# Patient Record
Sex: Male | Born: 1950 | Race: Black or African American | Hispanic: No
Health system: Southern US, Community
[De-identification: ages and names within clinical notes are randomized; demographics above are authoritative.]

---

## 2013-02-23 ENCOUNTER — Ambulatory Visit: Payer: Self-pay | Admitting: Radiation Oncology

## 2013-03-02 ENCOUNTER — Ambulatory Visit: Payer: Self-pay | Admitting: Oncology

## 2013-03-02 LAB — CBC CANCER CENTER
Basophil #: 0 x10 3/mm (ref 0.0–0.1)
Basophil %: 0.1 %
Eosinophil %: 0 %
HCT: 29.6 % — ABNORMAL LOW (ref 40.0–52.0)
Lymphocyte #: 0.6 x10 3/mm — ABNORMAL LOW (ref 1.0–3.6)
MCH: 22.1 pg — ABNORMAL LOW (ref 26.0–34.0)
Monocyte #: 0.6 x10 3/mm (ref 0.2–1.0)
Monocyte %: 4.9 %
Neutrophil %: 90 %
RBC: 4.05 10*6/uL — ABNORMAL LOW (ref 4.40–5.90)
WBC: 11.4 x10 3/mm — ABNORMAL HIGH (ref 3.8–10.6)

## 2013-03-02 LAB — COMPREHENSIVE METABOLIC PANEL
Albumin: 3.1 g/dL — ABNORMAL LOW (ref 3.4–5.0)
BUN: 24 mg/dL — ABNORMAL HIGH (ref 7–18)
Bilirubin,Total: 0.3 mg/dL (ref 0.2–1.0)
Calcium, Total: 8.6 mg/dL (ref 8.5–10.1)
Chloride: 104 mmol/L (ref 98–107)
Co2: 28 mmol/L (ref 21–32)
Creatinine: 0.96 mg/dL (ref 0.60–1.30)
EGFR (African American): >60
SGOT(AST): 12 U/L — ABNORMAL LOW (ref 15–37)
Sodium: 144 mmol/L (ref 136–145)
Total Protein: 7.5 g/dL (ref 6.4–8.2)

## 2013-03-03 LAB — CEA: CEA: 379.3 ng/mL — ABNORMAL HIGH (ref 0.0–4.7)

## 2013-03-13 ENCOUNTER — Ambulatory Visit: Payer: Self-pay | Admitting: Vascular Surgery

## 2013-03-14 ENCOUNTER — Ambulatory Visit: Payer: Self-pay | Admitting: Radiation Oncology

## 2013-03-14 ENCOUNTER — Ambulatory Visit: Payer: Self-pay | Admitting: Oncology

## 2013-03-16 LAB — PROTIME-INR: INR: 1.1

## 2013-03-21 LAB — CBC CANCER CENTER
Basophil #: 0.1 x10 3/mm (ref 0.0–0.1)
HCT: 31.6 % — ABNORMAL LOW (ref 40.0–52.0)
HGB: 9.6 g/dL — ABNORMAL LOW (ref 13.0–18.0)
Lymphocyte #: 1.3 x10 3/mm (ref 1.0–3.6)
Lymphocyte %: 10.6 %
MCHC: 30.3 g/dL — ABNORMAL LOW (ref 32.0–36.0)
Monocyte %: 12.7 %
Neutrophil #: 9.6 x10 3/mm — ABNORMAL HIGH (ref 1.4–6.5)
RBC: 4.19 10*6/uL — ABNORMAL LOW (ref 4.40–5.90)
RDW: 31.2 % — ABNORMAL HIGH (ref 11.5–14.5)
WBC: 12.7 x10 3/mm — ABNORMAL HIGH (ref 3.8–10.6)

## 2013-03-21 LAB — COMPREHENSIVE METABOLIC PANEL
Albumin: 2.9 g/dL — ABNORMAL LOW (ref 3.4–5.0)
Anion Gap: 11 (ref 7–16)
Calcium, Total: 8.2 mg/dL — ABNORMAL LOW (ref 8.5–10.1)
Co2: 30 mmol/L (ref 21–32)
Creatinine: 1.08 mg/dL (ref 0.60–1.30)
EGFR (African American): >60
EGFR (Non-African Amer.): >60
Glucose: 125 mg/dL — ABNORMAL HIGH (ref 65–99)
Osmolality: 288 (ref 275–301)
Potassium: 2.5 mmol/L — CL (ref 3.5–5.1)
SGPT (ALT): 22 U/L (ref 12–78)
Sodium: 144 mmol/L (ref 136–145)

## 2013-03-21 LAB — PROTIME-INR
INR: 1.6
Prothrombin Time: 19.3 secs — ABNORMAL HIGH (ref 11.5–14.7)

## 2013-03-28 LAB — CBC CANCER CENTER
Basophil #: 0 x10 3/mm (ref 0.0–0.1)
Eosinophil #: 0.1 x10 3/mm (ref 0.0–0.7)
Eosinophil %: 1.2 %
HCT: 30.1 % — ABNORMAL LOW (ref 40.0–52.0)
HGB: 9.2 g/dL — ABNORMAL LOW (ref 13.0–18.0)
Lymphocyte #: 1.4 x10 3/mm (ref 1.0–3.6)
Lymphocyte %: 15 %
Lymphocytes: 15 %
MCHC: 30.6 g/dL — ABNORMAL LOW (ref 32.0–36.0)
Monocyte %: 8.1 %
Monocytes: 5 %
Neutrophil #: 7 x10 3/mm — ABNORMAL HIGH (ref 1.4–6.5)
RBC: 3.97 10*6/uL — ABNORMAL LOW (ref 4.40–5.90)
WBC: 9.4 x10 3/mm (ref 3.8–10.6)

## 2013-03-28 LAB — COMPREHENSIVE METABOLIC PANEL
Albumin: 2.7 g/dL — ABNORMAL LOW (ref 3.4–5.0)
Alkaline Phosphatase: 80 U/L (ref 50–136)
Anion Gap: 6 — ABNORMAL LOW (ref 7–16)
Calcium, Total: 8.3 mg/dL — ABNORMAL LOW (ref 8.5–10.1)
Co2: 31 mmol/L (ref 21–32)
Creatinine: 1.08 mg/dL (ref 0.60–1.30)
EGFR (African American): >60
EGFR (Non-African Amer.): >60
Glucose: 137 mg/dL — ABNORMAL HIGH (ref 65–99)
Osmolality: 286 (ref 275–301)
SGPT (ALT): 26 U/L (ref 12–78)
Sodium: 143 mmol/L (ref 136–145)
Total Protein: 6.8 g/dL (ref 6.4–8.2)

## 2013-03-28 LAB — PROTIME-INR: Prothrombin Time: 26.7 secs — ABNORMAL HIGH (ref 11.5–14.7)

## 2013-04-03 LAB — CBC CANCER CENTER
Basophil %: 0.7 %
Eosinophil #: 0.1 x10 3/mm (ref 0.0–0.7)
Eosinophil %: 1 %
HCT: 31.2 % — ABNORMAL LOW (ref 40.0–52.0)
HGB: 9.7 g/dL — ABNORMAL LOW (ref 13.0–18.0)
Lymphocyte %: 14.4 %
MCH: 23.2 pg — ABNORMAL LOW (ref 26.0–34.0)
MCHC: 30.9 g/dL — ABNORMAL LOW (ref 32.0–36.0)
MCV: 75 fL — ABNORMAL LOW (ref 80–100)
Monocyte %: 5.5 %
Neutrophil #: 8.6 x10 3/mm — ABNORMAL HIGH (ref 1.4–6.5)
Platelet: 276 x10 3/mm (ref 150–440)
RBC: 4.16 10*6/uL — ABNORMAL LOW (ref 4.40–5.90)
WBC: 11 x10 3/mm — ABNORMAL HIGH (ref 3.8–10.6)

## 2013-04-03 LAB — COMPREHENSIVE METABOLIC PANEL
Alkaline Phosphatase: 87 U/L (ref 50–136)
Anion Gap: 9 (ref 7–16)
BUN: 13 mg/dL (ref 7–18)
Bilirubin,Total: 0.3 mg/dL (ref 0.2–1.0)
Calcium, Total: 8.5 mg/dL (ref 8.5–10.1)
Co2: 30 mmol/L (ref 21–32)
Creatinine: 1.06 mg/dL (ref 0.60–1.30)
EGFR (African American): >60
Osmolality: 287 (ref 275–301)
SGOT(AST): 23 U/L (ref 15–37)
Sodium: 144 mmol/L (ref 136–145)

## 2013-04-03 LAB — PROTIME-INR: Prothrombin Time: 32.3 secs — ABNORMAL HIGH (ref 11.5–14.7)

## 2013-04-03 LAB — IRON AND TIBC
Iron Saturation: 15 %
Iron: 52 ug/dL — ABNORMAL LOW (ref 65–175)

## 2013-04-10 LAB — CBC CANCER CENTER
Basophil #: 0 "x10 3/mm "
Basophil %: 0.3 %
Eosinophil #: 0.2 "x10 3/mm "
Eosinophil %: 3.8 %
HCT: 32.7 % — ABNORMAL LOW
HGB: 10.4 g/dL — ABNORMAL LOW
Lymphocyte %: 22.9 %
Lymphs Abs: 1.1 "x10 3/mm "
MCH: 24.1 pg — ABNORMAL LOW
MCHC: 31.7 g/dL — ABNORMAL LOW
MCV: 76 fL — ABNORMAL LOW
Monocyte #: 0.7 "x10 3/mm "
Monocyte %: 13.4 %
Neutrophil #: 3 "x10 3/mm "
Neutrophil %: 59.6 %
Platelet: 261 "x10 3/mm "
RBC: 4.29 "x10 6/mm " — ABNORMAL LOW
RDW: 28.6 % — ABNORMAL HIGH
WBC: 5 "x10 3/mm "

## 2013-04-10 LAB — COMPREHENSIVE METABOLIC PANEL WITH GFR
Albumin: 2.7 g/dL — ABNORMAL LOW
Alkaline Phosphatase: 76 U/L
Anion Gap: 9
BUN: 16 mg/dL
Bilirubin,Total: 0.4 mg/dL
Calcium, Total: 8 mg/dL — ABNORMAL LOW
Chloride: 106 mmol/L
Co2: 28 mmol/L
Creatinine: 1.02 mg/dL
EGFR (African American): >60
EGFR (Non-African Amer.): >60
Glucose: 102 mg/dL — ABNORMAL HIGH
Osmolality: 286
Potassium: 3.3 mmol/L — ABNORMAL LOW
SGOT(AST): 17 U/L
SGPT (ALT): 22 U/L
Sodium: 143 mmol/L
Total Protein: 6.2 g/dL — ABNORMAL LOW

## 2013-04-13 ENCOUNTER — Ambulatory Visit: Payer: Self-pay | Admitting: Radiation Oncology

## 2013-04-13 ENCOUNTER — Ambulatory Visit: Payer: Self-pay | Admitting: Oncology

## 2013-04-17 LAB — COMPREHENSIVE METABOLIC PANEL
Albumin: 2.7 g/dL — ABNORMAL LOW (ref 3.4–5.0)
Alkaline Phosphatase: 75 U/L (ref 50–136)
Anion Gap: 11 (ref 7–16)
BUN: 11 mg/dL (ref 7–18)
Bilirubin,Total: 0.4 mg/dL (ref 0.2–1.0)
Chloride: 107 mmol/L (ref 98–107)
Co2: 28 mmol/L (ref 21–32)
Creatinine: 0.98 mg/dL (ref 0.60–1.30)
EGFR (African American): >60
EGFR (Non-African Amer.): >60
Glucose: 110 mg/dL — ABNORMAL HIGH (ref 65–99)
Potassium: 2.7 mmol/L — ABNORMAL LOW (ref 3.5–5.1)
SGOT(AST): 21 U/L (ref 15–37)
SGPT (ALT): 26 U/L (ref 12–78)
Total Protein: 5.6 g/dL — ABNORMAL LOW (ref 6.4–8.2)

## 2013-04-17 LAB — IRON AND TIBC
Iron Bind.Cap.(Total): 277 ug/dL (ref 250–450)
Iron Saturation: 14 %
Iron: 39 ug/dL — ABNORMAL LOW (ref 65–175)
Unbound Iron-Bind.Cap.: 238 ug/dL

## 2013-04-17 LAB — CBC CANCER CENTER
Basophil #: 0 x10 3/mm (ref 0.0–0.1)
Basophil %: 0.5 %
Eosinophil %: 4.1 %
HCT: 34.4 % — ABNORMAL LOW (ref 40.0–52.0)
HGB: 10.5 g/dL — ABNORMAL LOW (ref 13.0–18.0)
Lymphocyte %: 19.1 %
MCV: 78 fL — ABNORMAL LOW (ref 80–100)
Platelet: 169 x10 3/mm (ref 150–440)
RBC: 4.43 10*6/uL (ref 4.40–5.90)
RDW: 27.8 % — ABNORMAL HIGH (ref 11.5–14.5)
WBC: 8 x10 3/mm (ref 3.8–10.6)

## 2013-04-17 LAB — PROTIME-INR: Prothrombin Time: 20 secs — ABNORMAL HIGH (ref 11.5–14.7)

## 2013-04-17 LAB — FERRITIN: Ferritin (ARMC): 50 ng/mL (ref 8–388)

## 2013-04-24 LAB — CBC CANCER CENTER
Eosinophil #: 0.3 x10 3/mm (ref 0.0–0.7)
HCT: 32.1 % — ABNORMAL LOW (ref 40.0–52.0)
HGB: 10.3 g/dL — ABNORMAL LOW (ref 13.0–18.0)
Lymphocyte #: 1.4 x10 3/mm (ref 1.0–3.6)
Lymphocyte %: 21.8 %
MCHC: 32.1 g/dL (ref 32.0–36.0)
MCV: 78 fL — ABNORMAL LOW (ref 80–100)
Monocyte #: 0.6 x10 3/mm (ref 0.2–1.0)
Monocyte %: 8.8 %
Neutrophil #: 4.1 x10 3/mm (ref 1.4–6.5)
Neutrophil %: 63.4 %
RBC: 4.14 10*6/uL — ABNORMAL LOW (ref 4.40–5.90)
WBC: 6.5 x10 3/mm (ref 3.8–10.6)

## 2013-04-24 LAB — COMPREHENSIVE METABOLIC PANEL
Albumin: 2.7 g/dL — ABNORMAL LOW (ref 3.4–5.0)
Calcium, Total: 7.8 mg/dL — ABNORMAL LOW (ref 8.5–10.1)
Glucose: 112 mg/dL — ABNORMAL HIGH (ref 65–99)
Osmolality: 287 (ref 275–301)
Potassium: 3.6 mmol/L (ref 3.5–5.1)
SGPT (ALT): 25 U/L (ref 12–78)
Sodium: 143 mmol/L (ref 136–145)

## 2013-04-24 LAB — PROTIME-INR: Prothrombin Time: 17 secs — ABNORMAL HIGH (ref 11.5–14.7)

## 2013-04-26 ENCOUNTER — Ambulatory Visit: Payer: Self-pay | Admitting: Vascular Surgery

## 2013-05-02 LAB — CBC CANCER CENTER
Basophil #: 0.1 x10 3/mm (ref 0.0–0.1)
Basophil %: 1 %
Eosinophil #: 0.3 x10 3/mm (ref 0.0–0.7)
Eosinophil %: 5.1 %
HCT: 33.3 % — ABNORMAL LOW (ref 40.0–52.0)
Lymphocyte #: 1.7 x10 3/mm (ref 1.0–3.6)
Lymphocyte %: 26.8 %
MCHC: 32 g/dL (ref 32.0–36.0)
MCV: 80 fL (ref 80–100)
Monocyte #: 0.9 x10 3/mm (ref 0.2–1.0)
Monocyte %: 13.7 %
Neutrophil #: 3.4 x10 3/mm (ref 1.4–6.5)
Neutrophil %: 53.4 %
Platelet: 145 x10 3/mm — ABNORMAL LOW (ref 150–440)
RBC: 4.19 10*6/uL — ABNORMAL LOW (ref 4.40–5.90)
RDW: 29.5 % — ABNORMAL HIGH (ref 11.5–14.5)
WBC: 6.3 x10 3/mm (ref 3.8–10.6)

## 2013-05-02 LAB — COMPREHENSIVE METABOLIC PANEL
Alkaline Phosphatase: 88 U/L (ref 50–136)
Bilirubin,Total: 0.4 mg/dL (ref 0.2–1.0)
EGFR (Non-African Amer.): >60
Glucose: 123 mg/dL — ABNORMAL HIGH (ref 65–99)
Potassium: 3.3 mmol/L — ABNORMAL LOW (ref 3.5–5.1)
SGPT (ALT): 28 U/L (ref 12–78)
Sodium: 143 mmol/L (ref 136–145)
Total Protein: 5.9 g/dL — ABNORMAL LOW (ref 6.4–8.2)

## 2013-05-02 LAB — PROTIME-INR: Prothrombin Time: 31.2 secs — ABNORMAL HIGH (ref 11.5–14.7)

## 2013-05-09 LAB — CBC CANCER CENTER
Basophil #: 0 x10 3/mm (ref 0.0–0.1)
Basophil %: 0.4 %
Eosinophil #: 0.1 x10 3/mm (ref 0.0–0.7)
Eosinophil %: 2.3 %
HCT: 32.2 % — ABNORMAL LOW (ref 40.0–52.0)
Lymphocyte #: 1.5 x10 3/mm (ref 1.0–3.6)
MCH: 25.7 pg — ABNORMAL LOW (ref 26.0–34.0)
Monocyte #: 0.5 x10 3/mm (ref 0.2–1.0)
Monocyte %: 7.8 %
Neutrophil #: 3.9 x10 3/mm (ref 1.4–6.5)
Neutrophil %: 64.6 %
RBC: 4.01 10*6/uL — ABNORMAL LOW (ref 4.40–5.90)
RDW: 29 % — ABNORMAL HIGH (ref 11.5–14.5)
WBC: 6.1 x10 3/mm (ref 3.8–10.6)

## 2013-05-09 LAB — BASIC METABOLIC PANEL
Anion Gap: 8 (ref 7–16)
Chloride: 105 mmol/L (ref 98–107)
Co2: 29 mmol/L (ref 21–32)
Creatinine: 0.96 mg/dL (ref 0.60–1.30)
EGFR (African American): >60
Potassium: 3.2 mmol/L — ABNORMAL LOW (ref 3.5–5.1)

## 2013-05-09 LAB — PROTIME-INR
INR: 2.1
Prothrombin Time: 22.9 secs — ABNORMAL HIGH (ref 11.5–14.7)

## 2013-05-14 ENCOUNTER — Ambulatory Visit: Payer: Self-pay | Admitting: Oncology

## 2013-05-14 ENCOUNTER — Ambulatory Visit: Payer: Self-pay | Admitting: Radiation Oncology

## 2013-05-16 LAB — CBC CANCER CENTER
Basophil %: 1.5 %
Eosinophil #: 0.1 x10 3/mm (ref 0.0–0.7)
Lymphocyte #: 1.3 x10 3/mm (ref 1.0–3.6)
MCH: 26.4 pg (ref 26.0–34.0)
MCHC: 32.1 g/dL (ref 32.0–36.0)
MCV: 82 fL (ref 80–100)
Monocyte %: 18.4 %
Neutrophil #: 1.5 x10 3/mm (ref 1.4–6.5)
Neutrophil %: 41.1 %
Platelet: 177 x10 3/mm (ref 150–440)
RBC: 3.95 10*6/uL — ABNORMAL LOW (ref 4.40–5.90)
RDW: 29.6 % — ABNORMAL HIGH (ref 11.5–14.5)
WBC: 3.8 x10 3/mm (ref 3.8–10.6)

## 2013-05-16 LAB — BASIC METABOLIC PANEL
Anion Gap: 10 (ref 7–16)
BUN: 15 mg/dL (ref 7–18)
Calcium, Total: 7.7 mg/dL — ABNORMAL LOW (ref 8.5–10.1)
Chloride: 108 mmol/L — ABNORMAL HIGH (ref 98–107)
Creatinine: 1.19 mg/dL (ref 0.60–1.30)
EGFR (African American): >60
Osmolality: 290 (ref 275–301)
Potassium: 3 mmol/L — ABNORMAL LOW (ref 3.5–5.1)
Sodium: 144 mmol/L (ref 136–145)

## 2013-05-16 LAB — PROTIME-INR: INR: 2.6

## 2013-05-23 LAB — PROTIME-INR: INR: 1.7

## 2013-05-30 LAB — CBC CANCER CENTER
Basophil #: 0 x10 3/mm (ref 0.0–0.1)
Basophil %: 0.8 %
Eosinophil #: 0 x10 3/mm (ref 0.0–0.7)
Eosinophil %: 0.9 %
HCT: 31.8 % — ABNORMAL LOW (ref 40.0–52.0)
HGB: 10.3 g/dL — ABNORMAL LOW (ref 13.0–18.0)
Lymphocyte #: 1.8 x10 3/mm (ref 1.0–3.6)
Lymphocyte %: 35.3 %
MCH: 27 pg (ref 26.0–34.0)
MCHC: 32.3 g/dL (ref 32.0–36.0)
MCV: 84 fL (ref 80–100)
Monocyte #: 0.9 x10 3/mm (ref 0.2–1.0)
Monocyte %: 17.9 %
Neutrophil #: 2.4 x10 3/mm (ref 1.4–6.5)
Neutrophil %: 45.1 %
Platelet: 156 x10 3/mm (ref 150–440)
RBC: 3.8 10*6/uL — ABNORMAL LOW (ref 4.40–5.90)
RDW: 27.3 % — ABNORMAL HIGH (ref 11.5–14.5)
WBC: 5.2 x10 3/mm (ref 3.8–10.6)

## 2013-05-30 LAB — COMPREHENSIVE METABOLIC PANEL
Albumin: 2.8 g/dL — ABNORMAL LOW (ref 3.4–5.0)
Alkaline Phosphatase: 121 U/L (ref 50–136)
BUN: 12 mg/dL (ref 7–18)
Bilirubin,Total: 0.5 mg/dL (ref 0.2–1.0)
Calcium, Total: 8.3 mg/dL — ABNORMAL LOW (ref 8.5–10.1)
Chloride: 108 mmol/L — ABNORMAL HIGH (ref 98–107)
Co2: 29 mmol/L (ref 21–32)
Glucose: 96 mg/dL (ref 65–99)
Osmolality: 281 (ref 275–301)
Potassium: 3.7 mmol/L (ref 3.5–5.1)
SGOT(AST): 15 U/L (ref 15–37)
SGPT (ALT): 21 U/L (ref 12–78)
Sodium: 141 mmol/L (ref 136–145)
Total Protein: 6.3 g/dL — ABNORMAL LOW (ref 6.4–8.2)

## 2013-05-30 LAB — PROTIME-INR: INR: 3.1

## 2013-06-13 ENCOUNTER — Ambulatory Visit: Payer: Self-pay | Admitting: Oncology

## 2013-06-13 ENCOUNTER — Ambulatory Visit: Payer: Self-pay | Admitting: Radiation Oncology

## 2013-06-13 LAB — COMPREHENSIVE METABOLIC PANEL
Albumin: 2.8 g/dL — ABNORMAL LOW (ref 3.4–5.0)
Bilirubin,Total: 0.9 mg/dL (ref 0.2–1.0)
Calcium, Total: 8.7 mg/dL (ref 8.5–10.1)
Chloride: 105 mmol/L (ref 98–107)
Co2: 26 mmol/L (ref 21–32)
Creatinine: 1.3 mg/dL (ref 0.60–1.30)
EGFR (Non-African Amer.): 59 — ABNORMAL LOW
Osmolality: 282 (ref 275–301)
Potassium: 3.4 mmol/L — ABNORMAL LOW (ref 3.5–5.1)
SGOT(AST): 18 U/L (ref 15–37)

## 2013-06-13 LAB — PROTIME-INR: Prothrombin Time: 14 secs (ref 11.5–14.7)

## 2013-06-13 LAB — CBC CANCER CENTER
Basophil %: 0.6 %
Eosinophil #: 0.1 x10 3/mm (ref 0.0–0.7)
MCH: 27.6 pg (ref 26.0–34.0)
MCHC: 32.5 g/dL (ref 32.0–36.0)
MCV: 85 fL (ref 80–100)
Monocyte %: 18.5 %
Neutrophil %: 66.5 %

## 2013-06-20 LAB — PROTIME-INR: Prothrombin Time: 14.4 secs (ref 11.5–14.7)

## 2013-06-27 LAB — CBC CANCER CENTER
Basophil #: 0 x10 3/mm (ref 0.0–0.1)
Eosinophil #: 0.1 x10 3/mm (ref 0.0–0.7)
HCT: 33.2 % — ABNORMAL LOW (ref 40.0–52.0)
HGB: 10.7 g/dL — ABNORMAL LOW (ref 13.0–18.0)
Lymphocyte #: 1.9 x10 3/mm (ref 1.0–3.6)
MCH: 28.7 pg (ref 26.0–34.0)
MCHC: 32.3 g/dL (ref 32.0–36.0)
Monocyte #: 1.2 x10 3/mm — ABNORMAL HIGH (ref 0.2–1.0)
Monocyte %: 16.8 %
Neutrophil #: 3.7 x10 3/mm (ref 1.4–6.5)
Platelet: 92 x10 3/mm — ABNORMAL LOW (ref 150–440)
RDW: 27.3 % — ABNORMAL HIGH (ref 11.5–14.5)
WBC: 6.9 x10 3/mm (ref 3.8–10.6)

## 2013-06-27 LAB — COMPREHENSIVE METABOLIC PANEL
Albumin: 2.9 g/dL — ABNORMAL LOW (ref 3.4–5.0)
Alkaline Phosphatase: 89 U/L (ref 50–136)
Calcium, Total: 8.3 mg/dL — ABNORMAL LOW (ref 8.5–10.1)
Chloride: 108 mmol/L — ABNORMAL HIGH (ref 98–107)
Co2: 27 mmol/L (ref 21–32)
Creatinine: 1.25 mg/dL (ref 0.60–1.30)
EGFR (Non-African Amer.): >60
Glucose: 95 mg/dL (ref 65–99)
SGPT (ALT): 17 U/L (ref 12–78)
Sodium: 145 mmol/L (ref 136–145)

## 2013-06-27 LAB — PROTIME-INR
INR: 1.2
Prothrombin Time: 15 secs — ABNORMAL HIGH (ref 11.5–14.7)

## 2013-07-04 LAB — PROTIME-INR
INR: 1.8
Prothrombin Time: 20.9 secs — ABNORMAL HIGH (ref 11.5–14.7)

## 2013-07-04 LAB — COMPREHENSIVE METABOLIC PANEL
Albumin: 2.8 g/dL — ABNORMAL LOW (ref 3.4–5.0)
BUN: 12 mg/dL (ref 7–18)
Calcium, Total: 8.5 mg/dL (ref 8.5–10.1)
Chloride: 108 mmol/L — ABNORMAL HIGH (ref 98–107)
Co2: 28 mmol/L (ref 21–32)
Creatinine: 0.98 mg/dL (ref 0.60–1.30)
EGFR (African American): >60
Osmolality: 280 (ref 275–301)
SGOT(AST): 59 U/L — ABNORMAL HIGH (ref 15–37)
SGPT (ALT): 37 U/L (ref 12–78)

## 2013-07-04 LAB — CBC CANCER CENTER
Basophil #: 0 x10 3/mm (ref 0.0–0.1)
Basophil %: 0.7 %
Eosinophil %: 3.2 %
HCT: 33.4 % — ABNORMAL LOW (ref 40.0–52.0)
Lymphocyte #: 1.7 x10 3/mm (ref 1.0–3.6)
Lymphocyte %: 43.5 %
MCV: 91 fL (ref 80–100)
Monocyte #: 1.4 x10 3/mm — ABNORMAL HIGH (ref 0.2–1.0)
Neutrophil %: 16.2 %
Platelet: 166 x10 3/mm (ref 150–440)
RBC: 3.68 10*6/uL — ABNORMAL LOW (ref 4.40–5.90)
RDW: 28 % — ABNORMAL HIGH (ref 11.5–14.5)
WBC: 3.9 x10 3/mm (ref 3.8–10.6)

## 2013-07-11 LAB — CBC CANCER CENTER
Basophil #: 0.1 x10 3/mm (ref 0.0–0.1)
Basophil %: 0.8 %
Eosinophil #: 0.1 x10 3/mm (ref 0.0–0.7)
Eosinophil %: 1.8 %
HCT: 32.6 % — ABNORMAL LOW (ref 40.0–52.0)
Lymphocyte #: 2 x10 3/mm (ref 1.0–3.6)
Lymphocyte %: 27.7 %
MCH: 29.4 pg (ref 26.0–34.0)
MCV: 89 fL (ref 80–100)
Monocyte #: 1.7 x10 3/mm — ABNORMAL HIGH (ref 0.2–1.0)
Monocyte %: 23.7 %
Neutrophil %: 46 %
Platelet: 167 x10 3/mm (ref 150–440)
RBC: 3.65 10*6/uL — ABNORMAL LOW (ref 4.40–5.90)

## 2013-07-11 LAB — COMPREHENSIVE METABOLIC PANEL
Albumin: 2.4 g/dL — ABNORMAL LOW (ref 3.4–5.0)
Anion Gap: 4 — ABNORMAL LOW (ref 7–16)
Bilirubin,Total: 0.4 mg/dL (ref 0.2–1.0)
Calcium, Total: 8.1 mg/dL — ABNORMAL LOW (ref 8.5–10.1)
Chloride: 108 mmol/L — ABNORMAL HIGH (ref 98–107)
Co2: 30 mmol/L (ref 21–32)
EGFR (African American): >60
EGFR (Non-African Amer.): >60
Glucose: 84 mg/dL (ref 65–99)
Osmolality: 284 (ref 275–301)
SGOT(AST): 19 U/L (ref 15–37)
Total Protein: 6 g/dL — ABNORMAL LOW (ref 6.4–8.2)

## 2013-07-14 ENCOUNTER — Ambulatory Visit: Payer: Self-pay | Admitting: Radiation Oncology

## 2013-07-14 ENCOUNTER — Ambulatory Visit: Payer: Self-pay | Admitting: Oncology

## 2013-07-18 LAB — COMPREHENSIVE METABOLIC PANEL
Anion Gap: 8 (ref 7–16)
Bilirubin,Total: 0.7 mg/dL (ref 0.2–1.0)
Calcium, Total: 8.4 mg/dL — ABNORMAL LOW (ref 8.5–10.1)
Creatinine: 0.99 mg/dL (ref 0.60–1.30)
EGFR (African American): >60
Glucose: 116 mg/dL — ABNORMAL HIGH (ref 65–99)
Osmolality: 285 (ref 275–301)
SGOT(AST): 19 U/L (ref 15–37)
Sodium: 142 mmol/L (ref 136–145)
Total Protein: 6.7 g/dL (ref 6.4–8.2)

## 2013-07-18 LAB — PROTIME-INR
INR: 1.3
Prothrombin Time: 16.1 secs — ABNORMAL HIGH (ref 11.5–14.7)

## 2013-07-18 LAB — CBC CANCER CENTER
Basophil #: 0.1 x10 3/mm (ref 0.0–0.1)
Basophil %: 0.2 %
Eosinophil #: 0 x10 3/mm (ref 0.0–0.7)
Eosinophil %: 0 %
HCT: 31 % — ABNORMAL LOW (ref 40.0–52.0)
HGB: 10.2 g/dL — ABNORMAL LOW (ref 13.0–18.0)
MCH: 29.8 pg (ref 26.0–34.0)
MCHC: 33 g/dL (ref 32.0–36.0)
MCV: 90 fL (ref 80–100)
Monocyte #: 1.4 x10 3/mm — ABNORMAL HIGH (ref 0.2–1.0)
Neutrophil %: 91 %
Platelet: 137 x10 3/mm — ABNORMAL LOW (ref 150–440)
RBC: 3.43 10*6/uL — ABNORMAL LOW (ref 4.40–5.90)
RDW: 25.6 % — ABNORMAL HIGH (ref 11.5–14.5)
WBC: 43 x10 3/mm — ABNORMAL HIGH (ref 3.8–10.6)

## 2013-07-25 LAB — COMPREHENSIVE METABOLIC PANEL
Albumin: 2.5 g/dL — ABNORMAL LOW (ref 3.4–5.0)
Alkaline Phosphatase: 121 U/L (ref 50–136)
Anion Gap: 10 (ref 7–16)
BUN: 10 mg/dL (ref 7–18)
Chloride: 106 mmol/L (ref 98–107)
Co2: 27 mmol/L (ref 21–32)
EGFR (African American): >60
EGFR (Non-African Amer.): >60
Glucose: 124 mg/dL — ABNORMAL HIGH (ref 65–99)
Osmolality: 285 (ref 275–301)
Potassium: 3.4 mmol/L — ABNORMAL LOW (ref 3.5–5.1)
SGOT(AST): 11 U/L — ABNORMAL LOW (ref 15–37)
SGPT (ALT): 12 U/L (ref 12–78)
Sodium: 143 mmol/L (ref 136–145)
Total Protein: 5.9 g/dL — ABNORMAL LOW (ref 6.4–8.2)

## 2013-07-25 LAB — CBC CANCER CENTER
Basophil #: 0 x10 3/mm (ref 0.0–0.1)
Basophil %: 0.5 %
Eosinophil #: 0.1 x10 3/mm (ref 0.0–0.7)
Eosinophil %: 1.7 %
HGB: 10.3 g/dL — ABNORMAL LOW (ref 13.0–18.0)
Lymphocyte #: 1.5 x10 3/mm (ref 1.0–3.6)
Lymphocyte %: 17.4 %
MCH: 30.4 pg (ref 26.0–34.0)
MCHC: 33.1 g/dL (ref 32.0–36.0)
MCV: 92 fL (ref 80–100)
Monocyte #: 1 x10 3/mm (ref 0.2–1.0)
Neutrophil #: 5.8 x10 3/mm (ref 1.4–6.5)
Neutrophil %: 68.3 %
Platelet: 155 x10 3/mm (ref 150–440)
WBC: 8.5 x10 3/mm (ref 3.8–10.6)

## 2013-08-01 LAB — PROTIME-INR: Prothrombin Time: 33.1 secs — ABNORMAL HIGH (ref 11.5–14.7)

## 2013-08-08 LAB — CBC CANCER CENTER
Eosinophil %: 1.1 %
Lymphocyte %: 22.4 %
MCH: 30.4 pg (ref 26.0–34.0)
MCV: 92 fL (ref 80–100)
Monocyte #: 1.2 x10 3/mm — ABNORMAL HIGH (ref 0.2–1.0)
Monocyte %: 16.8 %
Neutrophil %: 58.9 %
Platelet: 145 x10 3/mm — ABNORMAL LOW (ref 150–440)
RBC: 3.61 10*6/uL — ABNORMAL LOW (ref 4.40–5.90)
RDW: 23.9 % — ABNORMAL HIGH (ref 11.5–14.5)

## 2013-08-08 LAB — PROTIME-INR
INR: 1.3
Prothrombin Time: 16.4 secs — ABNORMAL HIGH (ref 11.5–14.7)

## 2013-08-08 LAB — COMPREHENSIVE METABOLIC PANEL
Anion Gap: 8 (ref 7–16)
Bilirubin,Total: 0.4 mg/dL (ref 0.2–1.0)
Chloride: 106 mmol/L (ref 98–107)
Co2: 29 mmol/L (ref 21–32)
Creatinine: 1.11 mg/dL (ref 0.60–1.30)
EGFR (African American): >60
EGFR (Non-African Amer.): >60
Glucose: 117 mg/dL — ABNORMAL HIGH (ref 65–99)
Osmolality: 285 (ref 275–301)
SGOT(AST): 19 U/L (ref 15–37)
SGPT (ALT): 16 U/L (ref 12–78)
Sodium: 143 mmol/L (ref 136–145)

## 2013-08-14 ENCOUNTER — Ambulatory Visit: Payer: Self-pay | Admitting: Radiation Oncology

## 2013-08-14 ENCOUNTER — Ambulatory Visit: Payer: Self-pay | Admitting: Oncology

## 2013-08-22 LAB — COMPREHENSIVE METABOLIC PANEL
Albumin: 3 g/dL — ABNORMAL LOW (ref 3.4–5.0)
Alkaline Phosphatase: 131 U/L (ref 50–136)
Anion Gap: 10 (ref 7–16)
Bilirubin,Total: 0.6 mg/dL (ref 0.2–1.0)
Calcium, Total: 8.6 mg/dL (ref 8.5–10.1)
Chloride: 108 mmol/L — ABNORMAL HIGH (ref 98–107)
Co2: 28 mmol/L (ref 21–32)
Creatinine: 1.17 mg/dL (ref 0.60–1.30)
Osmolality: 291 (ref 275–301)
SGOT(AST): 14 U/L — ABNORMAL LOW (ref 15–37)
SGPT (ALT): 15 U/L (ref 12–78)
Sodium: 146 mmol/L — ABNORMAL HIGH (ref 136–145)

## 2013-08-22 LAB — CBC CANCER CENTER
Basophil #: 0.1 x10 3/mm (ref 0.0–0.1)
Eosinophil %: 1.3 %
HCT: 37.3 % — ABNORMAL LOW (ref 40.0–52.0)
Lymphocyte #: 1.8 x10 3/mm (ref 1.0–3.6)
Lymphocyte %: 20.6 %
MCH: 30.1 pg (ref 26.0–34.0)
MCHC: 32.4 g/dL (ref 32.0–36.0)
MCV: 93 fL (ref 80–100)
Monocyte #: 1.7 x10 3/mm — ABNORMAL HIGH (ref 0.2–1.0)
Monocyte %: 19.9 %
Neutrophil #: 5 x10 3/mm (ref 1.4–6.5)
Neutrophil %: 57.3 %
Platelet: 170 x10 3/mm (ref 150–440)
RBC: 4.02 10*6/uL — ABNORMAL LOW (ref 4.40–5.90)
WBC: 8.7 x10 3/mm (ref 3.8–10.6)

## 2013-08-22 LAB — PROTIME-INR
INR: 1.3
Prothrombin Time: 15.8 secs — ABNORMAL HIGH (ref 11.5–14.7)

## 2013-08-29 LAB — PROTIME-INR
INR: 2
Prothrombin Time: 22.6 s — ABNORMAL HIGH

## 2013-09-05 LAB — COMPREHENSIVE METABOLIC PANEL
Alkaline Phosphatase: 101 U/L (ref 50–136)
Anion Gap: 8 (ref 7–16)
BUN: 12 mg/dL (ref 7–18)
Bilirubin,Total: 0.5 mg/dL (ref 0.2–1.0)
Calcium, Total: 8 mg/dL — ABNORMAL LOW (ref 8.5–10.1)
Chloride: 107 mmol/L (ref 98–107)
Co2: 30 mmol/L (ref 21–32)
EGFR (African American): >60
Glucose: 93 mg/dL (ref 65–99)
Potassium: 2.9 mmol/L — ABNORMAL LOW (ref 3.5–5.1)
SGPT (ALT): 11 U/L — ABNORMAL LOW (ref 12–78)
Sodium: 145 mmol/L (ref 136–145)

## 2013-09-05 LAB — CBC CANCER CENTER
Bands: 7 %
Basophil: 1 %
Eosinophil: 1 %
HCT: 34.6 % — ABNORMAL LOW (ref 40.0–52.0)
HGB: 11.3 g/dL — ABNORMAL LOW (ref 13.0–18.0)
Lymphocytes: 21 %
MCHC: 32.6 g/dL (ref 32.0–36.0)
Platelet: 112 x10 3/mm — ABNORMAL LOW (ref 150–440)
RBC: 3.79 10*6/uL — ABNORMAL LOW (ref 4.40–5.90)
RDW: 22.4 % — ABNORMAL HIGH (ref 11.5–14.5)
Segmented Neutrophils: 45 %
Variant Lymphocyte: 1 %

## 2013-09-05 LAB — PROTIME-INR: Prothrombin Time: 23.2 secs — ABNORMAL HIGH (ref 11.5–14.7)

## 2013-09-07 LAB — CEA: CEA: 31.1 ng/mL — ABNORMAL HIGH (ref 0.0–4.7)

## 2013-09-13 ENCOUNTER — Ambulatory Visit: Payer: Self-pay | Admitting: Radiation Oncology

## 2013-09-13 ENCOUNTER — Ambulatory Visit: Payer: Self-pay | Admitting: Oncology

## 2013-09-26 LAB — CREATININE, SERUM
Creatinine: 1.11 mg/dL (ref 0.60–1.30)
EGFR (African American): >60
EGFR (Non-African Amer.): >60

## 2013-10-14 ENCOUNTER — Ambulatory Visit: Payer: Self-pay | Admitting: Oncology

## 2013-10-15 ENCOUNTER — Emergency Department: Payer: Self-pay | Admitting: Emergency Medicine

## 2013-10-15 LAB — CBC WITH DIFFERENTIAL/PLATELET
Basophil #: 0.2 10*3/uL — ABNORMAL HIGH (ref 0.0–0.1)
Basophil %: 2 %
Eosinophil %: 3.3 %
HCT: 33.4 % — ABNORMAL LOW (ref 40.0–52.0)
HGB: 11.2 g/dL — ABNORMAL LOW (ref 13.0–18.0)
Lymphocyte #: 2.2 10*3/uL (ref 1.0–3.6)
MCH: 31.8 pg (ref 26.0–34.0)
MCV: 96 fL (ref 80–100)
Neutrophil %: 55.3 %
Platelet: 211 10*3/uL (ref 150–440)
RBC: 3.5 10*6/uL — ABNORMAL LOW (ref 4.40–5.90)
RDW: 22 % — ABNORMAL HIGH (ref 11.5–14.5)
WBC: 9.6 10*3/uL (ref 3.8–10.6)

## 2013-10-16 ENCOUNTER — Ambulatory Visit: Payer: Self-pay | Admitting: Radiation Oncology

## 2013-11-14 ENCOUNTER — Ambulatory Visit: Payer: Self-pay | Admitting: Oncology

## 2013-11-23 LAB — CBC CANCER CENTER
Basophil #: 0.1 x10 3/mm (ref 0.0–0.1)
Basophil %: 0.7 %
Eosinophil #: 0.1 x10 3/mm (ref 0.0–0.7)
Eosinophil %: 0.6 %
HCT: 37 % — ABNORMAL LOW (ref 40.0–52.0)
HGB: 11.9 g/dL — ABNORMAL LOW (ref 13.0–18.0)
Lymphocyte #: 1.6 x10 3/mm (ref 1.0–3.6)
MCH: 31.6 pg (ref 26.0–34.0)
Monocyte #: 1.1 x10 3/mm — ABNORMAL HIGH (ref 0.2–1.0)
Neutrophil %: 68.7 %
RBC: 3.76 10*6/uL — ABNORMAL LOW (ref 4.40–5.90)
RDW: 17.2 % — ABNORMAL HIGH (ref 11.5–14.5)
WBC: 9 x10 3/mm (ref 3.8–10.6)

## 2013-11-23 LAB — COMPREHENSIVE METABOLIC PANEL
Albumin: 2.5 g/dL — ABNORMAL LOW (ref 3.4–5.0)
Alkaline Phosphatase: 69 U/L
Bilirubin,Total: 0.9 mg/dL (ref 0.2–1.0)
Chloride: 105 mmol/L (ref 98–107)
Co2: 32 mmol/L (ref 21–32)
EGFR (African American): >60
EGFR (Non-African Amer.): >60
Glucose: 101 mg/dL — ABNORMAL HIGH (ref 65–99)
Potassium: 3.4 mmol/L — ABNORMAL LOW (ref 3.5–5.1)
SGOT(AST): 19 U/L (ref 15–37)
Sodium: 144 mmol/L (ref 136–145)
Total Protein: 7.3 g/dL (ref 6.4–8.2)

## 2013-11-24 LAB — CEA: CEA: 73.7 ng/mL — ABNORMAL HIGH (ref 0.0–4.7)

## 2013-12-04 LAB — PROTIME-INR
INR: 1.3
Prothrombin Time: 16.3 secs — ABNORMAL HIGH (ref 11.5–14.7)

## 2013-12-14 ENCOUNTER — Ambulatory Visit: Payer: Self-pay | Admitting: Oncology

## 2013-12-21 LAB — COMPREHENSIVE METABOLIC PANEL
ALBUMIN: 3 g/dL — AB (ref 3.4–5.0)
ALK PHOS: 61 U/L
ALT: 13 U/L (ref 12–78)
Anion Gap: 7 (ref 7–16)
BILIRUBIN TOTAL: 0.4 mg/dL (ref 0.2–1.0)
BUN: 11 mg/dL (ref 7–18)
CO2: 29 mmol/L (ref 21–32)
CREATININE: 0.83 mg/dL (ref 0.60–1.30)
Calcium, Total: 9 mg/dL (ref 8.5–10.1)
Chloride: 106 mmol/L (ref 98–107)
EGFR (African American): >60
EGFR (Non-African Amer.): >60
Glucose: 98 mg/dL (ref 65–99)
Osmolality: 282 (ref 275–301)
Potassium: 3.8 mmol/L (ref 3.5–5.1)
SGOT(AST): 15 U/L (ref 15–37)
Sodium: 142 mmol/L (ref 136–145)
Total Protein: 7.4 g/dL (ref 6.4–8.2)

## 2013-12-21 LAB — CBC CANCER CENTER
BASOS PCT: 0.8 %
Basophil #: 0.1 x10 3/mm (ref 0.0–0.1)
EOS PCT: 2.7 %
Eosinophil #: 0.3 x10 3/mm (ref 0.0–0.7)
HCT: 36.2 % — ABNORMAL LOW (ref 40.0–52.0)
HGB: 11.7 g/dL — ABNORMAL LOW (ref 13.0–18.0)
LYMPHS ABS: 2 x10 3/mm (ref 1.0–3.6)
LYMPHS PCT: 19 %
MCH: 31 pg (ref 26.0–34.0)
MCHC: 32.3 g/dL (ref 32.0–36.0)
MCV: 96 fL (ref 80–100)
MONO ABS: 1.3 x10 3/mm — AB (ref 0.2–1.0)
Monocyte %: 11.8 %
Neutrophil #: 7.1 x10 3/mm — ABNORMAL HIGH (ref 1.4–6.5)
Neutrophil %: 65.7 %
Platelet: 250 x10 3/mm (ref 150–440)
RBC: 3.76 10*6/uL — ABNORMAL LOW (ref 4.40–5.90)
RDW: 16.6 % — ABNORMAL HIGH (ref 11.5–14.5)
WBC: 10.8 x10 3/mm — ABNORMAL HIGH (ref 3.8–10.6)

## 2013-12-22 LAB — CEA: CEA: 124.7 ng/mL — ABNORMAL HIGH (ref 0.0–4.7)

## 2013-12-26 LAB — COMPREHENSIVE METABOLIC PANEL
Albumin: 2.8 g/dL — ABNORMAL LOW (ref 3.4–5.0)
Alkaline Phosphatase: 71 U/L
Anion Gap: 2 — ABNORMAL LOW (ref 7–16)
BILIRUBIN TOTAL: 0.3 mg/dL (ref 0.2–1.0)
BUN: 14 mg/dL (ref 7–18)
CO2: 30 mmol/L (ref 21–32)
Calcium, Total: 8.8 mg/dL (ref 8.5–10.1)
Chloride: 108 mmol/L — ABNORMAL HIGH (ref 98–107)
Creatinine: 0.76 mg/dL (ref 0.60–1.30)
EGFR (Non-African Amer.): >60
Glucose: 95 mg/dL (ref 65–99)
OSMOLALITY: 280 (ref 275–301)
POTASSIUM: 3.9 mmol/L (ref 3.5–5.1)
SGOT(AST): 19 U/L (ref 15–37)
SGPT (ALT): 14 U/L (ref 12–78)
SODIUM: 140 mmol/L (ref 136–145)
Total Protein: 7.4 g/dL (ref 6.4–8.2)

## 2013-12-26 LAB — CBC CANCER CENTER
BASOS PCT: 0.8 %
Basophil #: 0.1 x10 3/mm (ref 0.0–0.1)
Eosinophil #: 0.4 x10 3/mm (ref 0.0–0.7)
Eosinophil %: 3.2 %
HCT: 34.6 % — ABNORMAL LOW (ref 40.0–52.0)
HGB: 11.3 g/dL — ABNORMAL LOW (ref 13.0–18.0)
Lymphocyte #: 2.2 x10 3/mm (ref 1.0–3.6)
Lymphocyte %: 19.7 %
MCH: 30.5 pg (ref 26.0–34.0)
MCHC: 32.5 g/dL (ref 32.0–36.0)
MCV: 94 fL (ref 80–100)
MONO ABS: 1.1 x10 3/mm — AB (ref 0.2–1.0)
Monocyte %: 9.4 %
NEUTROS ABS: 7.6 x10 3/mm — AB (ref 1.4–6.5)
NEUTROS PCT: 66.9 %
Platelet: 253 x10 3/mm (ref 150–440)
RBC: 3.69 10*6/uL — ABNORMAL LOW (ref 4.40–5.90)
RDW: 16.5 % — AB (ref 11.5–14.5)
WBC: 11.3 x10 3/mm — AB (ref 3.8–10.6)

## 2013-12-27 LAB — CEA: CEA: 148.4 ng/mL — ABNORMAL HIGH (ref 0.0–4.7)

## 2014-01-02 LAB — CBC CANCER CENTER
Basophil #: 0 x10 3/mm (ref 0.0–0.1)
Basophil %: 0.5 %
EOS ABS: 0.1 x10 3/mm (ref 0.0–0.7)
EOS PCT: 1.5 %
HCT: 34 % — AB (ref 40.0–52.0)
HGB: 10.9 g/dL — ABNORMAL LOW (ref 13.0–18.0)
LYMPHS ABS: 1.9 x10 3/mm (ref 1.0–3.6)
Lymphocyte %: 20.7 %
MCH: 30.2 pg (ref 26.0–34.0)
MCHC: 32 g/dL (ref 32.0–36.0)
MCV: 95 fL (ref 80–100)
MONO ABS: 0.6 x10 3/mm (ref 0.2–1.0)
MONOS PCT: 6.8 %
NEUTROS PCT: 70.5 %
Neutrophil #: 6.6 x10 3/mm — ABNORMAL HIGH (ref 1.4–6.5)
PLATELETS: 213 x10 3/mm (ref 150–440)
RBC: 3.6 10*6/uL — ABNORMAL LOW (ref 4.40–5.90)
RDW: 15.9 % — AB (ref 11.5–14.5)
WBC: 9.3 x10 3/mm (ref 3.8–10.6)

## 2014-01-02 LAB — COMPREHENSIVE METABOLIC PANEL
Albumin: 2.8 g/dL — ABNORMAL LOW (ref 3.4–5.0)
Alkaline Phosphatase: 77 U/L
Anion Gap: 2 — ABNORMAL LOW (ref 7–16)
BILIRUBIN TOTAL: 0.8 mg/dL (ref 0.2–1.0)
BUN: 12 mg/dL (ref 7–18)
CO2: 32 mmol/L (ref 21–32)
CREATININE: 0.61 mg/dL (ref 0.60–1.30)
Calcium, Total: 8.7 mg/dL (ref 8.5–10.1)
Chloride: 102 mmol/L (ref 98–107)
EGFR (African American): >60
GLUCOSE: 94 mg/dL (ref 65–99)
Osmolality: 271 (ref 275–301)
POTASSIUM: 3.9 mmol/L (ref 3.5–5.1)
SGOT(AST): 30 U/L (ref 15–37)
SGPT (ALT): 27 U/L (ref 12–78)
Sodium: 136 mmol/L (ref 136–145)
Total Protein: 7.1 g/dL (ref 6.4–8.2)

## 2014-01-02 LAB — PROTIME-INR
INR: 1.1
Prothrombin Time: 14.1 secs (ref 11.5–14.7)

## 2014-01-09 LAB — BASIC METABOLIC PANEL
Anion Gap: 6 — ABNORMAL LOW (ref 7–16)
BUN: 12 mg/dL (ref 7–18)
CO2: 29 mmol/L (ref 21–32)
Calcium, Total: 8 mg/dL — ABNORMAL LOW (ref 8.5–10.1)
Chloride: 107 mmol/L (ref 98–107)
Creatinine: 0.66 mg/dL (ref 0.60–1.30)
EGFR (African American): >60
EGFR (Non-African Amer.): >60
Glucose: 102 mg/dL — ABNORMAL HIGH (ref 65–99)
OSMOLALITY: 283 (ref 275–301)
Potassium: 2.7 mmol/L — ABNORMAL LOW (ref 3.5–5.1)
SODIUM: 142 mmol/L (ref 136–145)

## 2014-01-09 LAB — CBC CANCER CENTER
BASOS ABS: 0 x10 3/mm (ref 0.0–0.1)
Basophil %: 0.6 %
EOS PCT: 3.1 %
Eosinophil #: 0.2 x10 3/mm (ref 0.0–0.7)
HCT: 32.8 % — AB (ref 40.0–52.0)
HGB: 10.4 g/dL — ABNORMAL LOW (ref 13.0–18.0)
LYMPHS ABS: 1.7 x10 3/mm (ref 1.0–3.6)
Lymphocyte %: 23.3 %
MCH: 29.8 pg (ref 26.0–34.0)
MCHC: 31.6 g/dL — AB (ref 32.0–36.0)
MCV: 94 fL (ref 80–100)
Monocyte #: 0.6 x10 3/mm (ref 0.2–1.0)
Monocyte %: 8.1 %
NEUTROS PCT: 64.9 %
Neutrophil #: 4.8 x10 3/mm (ref 1.4–6.5)
Platelet: 234 x10 3/mm (ref 150–440)
RBC: 3.48 10*6/uL — ABNORMAL LOW (ref 4.40–5.90)
RDW: 16.1 % — ABNORMAL HIGH (ref 11.5–14.5)
WBC: 7.4 x10 3/mm (ref 3.8–10.6)

## 2014-01-09 LAB — MAGNESIUM: MAGNESIUM: 1.4 mg/dL — AB

## 2014-01-09 LAB — PROTIME-INR
INR: 1.2
Prothrombin Time: 15.4 secs — ABNORMAL HIGH (ref 11.5–14.7)

## 2014-01-09 LAB — PROTEIN, URINE, RANDOM: PROTEIN, RANDOM URINE: 48 mg/dL — AB (ref 0–12)

## 2014-01-14 ENCOUNTER — Ambulatory Visit: Payer: Self-pay | Admitting: Oncology

## 2014-01-16 LAB — COMPREHENSIVE METABOLIC PANEL
ALBUMIN: 2.9 g/dL — AB (ref 3.4–5.0)
AST: 16 U/L (ref 15–37)
Alkaline Phosphatase: 80 U/L
Anion Gap: 9 (ref 7–16)
BILIRUBIN TOTAL: 0.3 mg/dL (ref 0.2–1.0)
BUN: 17 mg/dL (ref 7–18)
CHLORIDE: 108 mmol/L — AB (ref 98–107)
Calcium, Total: 7.9 mg/dL — ABNORMAL LOW (ref 8.5–10.1)
Co2: 29 mmol/L (ref 21–32)
Creatinine: 0.83 mg/dL (ref 0.60–1.30)
EGFR (African American): >60
EGFR (Non-African Amer.): >60
GLUCOSE: 135 mg/dL — AB (ref 65–99)
OSMOLALITY: 294 (ref 275–301)
Potassium: 3.6 mmol/L (ref 3.5–5.1)
SGPT (ALT): 23 U/L (ref 12–78)
Sodium: 146 mmol/L — ABNORMAL HIGH (ref 136–145)
TOTAL PROTEIN: 6.6 g/dL (ref 6.4–8.2)

## 2014-01-16 LAB — CBC CANCER CENTER
Basophil #: 0 x10 3/mm (ref 0.0–0.1)
Basophil %: 0.5 %
Eosinophil #: 0.2 x10 3/mm (ref 0.0–0.7)
Eosinophil %: 4 %
HCT: 34.1 % — ABNORMAL LOW (ref 40.0–52.0)
HGB: 11 g/dL — ABNORMAL LOW (ref 13.0–18.0)
LYMPHS ABS: 1.8 x10 3/mm (ref 1.0–3.6)
Lymphocyte %: 41.9 %
MCH: 30.2 pg (ref 26.0–34.0)
MCHC: 32.4 g/dL (ref 32.0–36.0)
MCV: 93 fL (ref 80–100)
Monocyte #: 0.4 x10 3/mm (ref 0.2–1.0)
Monocyte %: 8.5 %
NEUTROS ABS: 2 x10 3/mm (ref 1.4–6.5)
NEUTROS PCT: 45.1 %
PLATELETS: 212 x10 3/mm (ref 150–440)
RBC: 3.66 10*6/uL — AB (ref 4.40–5.90)
RDW: 16.6 % — ABNORMAL HIGH (ref 11.5–14.5)
WBC: 4.4 x10 3/mm (ref 3.8–10.6)

## 2014-01-23 LAB — BASIC METABOLIC PANEL
Anion Gap: 9 (ref 7–16)
BUN: 16 mg/dL (ref 7–18)
CALCIUM: 7.8 mg/dL — AB (ref 8.5–10.1)
CO2: 27 mmol/L (ref 21–32)
Chloride: 110 mmol/L — ABNORMAL HIGH (ref 98–107)
Creatinine: 1.01 mg/dL (ref 0.60–1.30)
EGFR (Non-African Amer.): >60
Glucose: 96 mg/dL (ref 65–99)
Osmolality: 292 (ref 275–301)
Potassium: 3.8 mmol/L (ref 3.5–5.1)
SODIUM: 146 mmol/L — AB (ref 136–145)

## 2014-01-23 LAB — CBC CANCER CENTER
Basophil #: 0 x10 3/mm (ref 0.0–0.1)
Basophil %: 0.4 %
EOS ABS: 0.1 x10 3/mm (ref 0.0–0.7)
Eosinophil %: 1.9 %
HCT: 32.2 % — AB (ref 40.0–52.0)
HGB: 10.3 g/dL — ABNORMAL LOW (ref 13.0–18.0)
Lymphocyte #: 1.7 x10 3/mm (ref 1.0–3.6)
Lymphocyte %: 24.6 %
MCH: 29.9 pg (ref 26.0–34.0)
MCHC: 32.1 g/dL (ref 32.0–36.0)
MCV: 93 fL (ref 80–100)
MONOS PCT: 9.1 %
Monocyte #: 0.6 x10 3/mm (ref 0.2–1.0)
NEUTROS ABS: 4.5 x10 3/mm (ref 1.4–6.5)
Neutrophil %: 64 %
PLATELETS: 165 x10 3/mm (ref 150–440)
RBC: 3.45 10*6/uL — AB (ref 4.40–5.90)
RDW: 17 % — AB (ref 11.5–14.5)
WBC: 7 x10 3/mm (ref 3.8–10.6)

## 2014-01-23 LAB — MAGNESIUM: Magnesium: 1.3 mg/dL — ABNORMAL LOW

## 2014-01-23 LAB — PROTIME-INR
INR: 1.4
Prothrombin Time: 16.9 secs — ABNORMAL HIGH (ref 11.5–14.7)

## 2014-02-01 LAB — PROTIME-INR
INR: 1.1
Prothrombin Time: 14.5 secs (ref 11.5–14.7)

## 2014-02-06 LAB — BASIC METABOLIC PANEL
ANION GAP: 7 (ref 7–16)
BUN: 10 mg/dL (ref 7–18)
CALCIUM: 7.7 mg/dL — AB (ref 8.5–10.1)
CREATININE: 0.83 mg/dL (ref 0.60–1.30)
Chloride: 109 mmol/L — ABNORMAL HIGH (ref 98–107)
Co2: 27 mmol/L (ref 21–32)
GLUCOSE: 97 mg/dL (ref 65–99)
OSMOLALITY: 284 (ref 275–301)
Potassium: 3.3 mmol/L — ABNORMAL LOW (ref 3.5–5.1)
SODIUM: 143 mmol/L (ref 136–145)

## 2014-02-06 LAB — CBC CANCER CENTER
Basophil #: 0 x10 3/mm (ref 0.0–0.1)
Basophil %: 0.6 %
EOS PCT: 3.7 %
Eosinophil #: 0.2 x10 3/mm (ref 0.0–0.7)
HCT: 31 % — AB (ref 40.0–52.0)
HGB: 9.9 g/dL — ABNORMAL LOW (ref 13.0–18.0)
LYMPHS PCT: 24.3 %
Lymphocyte #: 1.5 x10 3/mm (ref 1.0–3.6)
MCH: 29.4 pg (ref 26.0–34.0)
MCHC: 32 g/dL (ref 32.0–36.0)
MCV: 92 fL (ref 80–100)
MONO ABS: 0.7 x10 3/mm (ref 0.2–1.0)
MONOS PCT: 11.7 %
NEUTROS ABS: 3.8 x10 3/mm (ref 1.4–6.5)
NEUTROS PCT: 59.7 %
Platelet: 161 x10 3/mm (ref 150–440)
RBC: 3.36 10*6/uL — ABNORMAL LOW (ref 4.40–5.90)
RDW: 17.8 % — ABNORMAL HIGH (ref 11.5–14.5)
WBC: 6.3 x10 3/mm (ref 3.8–10.6)

## 2014-02-06 LAB — MAGNESIUM: Magnesium: 1.4 mg/dL — ABNORMAL LOW

## 2014-02-06 LAB — PROTIME-INR
INR: 1.5
PROTHROMBIN TIME: 17.7 s — AB (ref 11.5–14.7)

## 2014-02-06 LAB — PROTEIN, URINE, RANDOM: PROTEIN, RANDOM URINE: 31 mg/dL — AB (ref 0–12)

## 2014-02-11 ENCOUNTER — Ambulatory Visit: Payer: Self-pay | Admitting: Oncology

## 2014-02-13 LAB — PROTIME-INR
INR: 1.2
Prothrombin Time: 14.9 secs — ABNORMAL HIGH (ref 11.5–14.7)

## 2014-02-20 LAB — BASIC METABOLIC PANEL
Anion Gap: 8 (ref 7–16)
BUN: 11 mg/dL (ref 7–18)
CALCIUM: 8.6 mg/dL (ref 8.5–10.1)
CHLORIDE: 104 mmol/L (ref 98–107)
Co2: 30 mmol/L (ref 21–32)
Creatinine: 0.85 mg/dL (ref 0.60–1.30)
Glucose: 96 mg/dL (ref 65–99)
OSMOLALITY: 282 (ref 275–301)
POTASSIUM: 3 mmol/L — AB (ref 3.5–5.1)
SODIUM: 142 mmol/L (ref 136–145)

## 2014-02-20 LAB — CBC CANCER CENTER
BASOS ABS: 0.1 x10 3/mm (ref 0.0–0.1)
Basophil %: 1.1 %
EOS ABS: 0.3 x10 3/mm (ref 0.0–0.7)
Eosinophil %: 3.9 %
HCT: 32.7 % — ABNORMAL LOW (ref 40.0–52.0)
HGB: 10.4 g/dL — ABNORMAL LOW (ref 13.0–18.0)
LYMPHS ABS: 1.8 x10 3/mm (ref 1.0–3.6)
LYMPHS PCT: 26.9 %
MCH: 28.9 pg (ref 26.0–34.0)
MCHC: 31.9 g/dL — ABNORMAL LOW (ref 32.0–36.0)
MCV: 91 fL (ref 80–100)
Monocyte #: 0.9 x10 3/mm (ref 0.2–1.0)
Monocyte %: 14.3 %
Neutrophil #: 3.5 x10 3/mm (ref 1.4–6.5)
Neutrophil %: 53.8 %
Platelet: 168 x10 3/mm (ref 150–440)
RBC: 3.6 10*6/uL — ABNORMAL LOW (ref 4.40–5.90)
RDW: 18.7 % — AB (ref 11.5–14.5)
WBC: 6.5 x10 3/mm (ref 3.8–10.6)

## 2014-02-20 LAB — PROTEIN, URINE, RANDOM: Protein, Random Urine: 14 mg/dL — ABNORMAL HIGH (ref 0–12)

## 2014-02-20 LAB — PROTIME-INR
INR: 1.4
Prothrombin Time: 16.7 secs — ABNORMAL HIGH (ref 11.5–14.7)

## 2014-02-20 LAB — MAGNESIUM: MAGNESIUM: 1.6 mg/dL — AB

## 2014-02-21 LAB — CEA: CEA: 112.8 ng/mL — ABNORMAL HIGH (ref 0.0–4.7)

## 2014-02-27 LAB — PROTIME-INR
INR: 1.3
Prothrombin Time: 15.8 secs — ABNORMAL HIGH (ref 11.5–14.7)

## 2014-03-06 LAB — BASIC METABOLIC PANEL
ANION GAP: 6 — AB (ref 7–16)
BUN: 17 mg/dL (ref 7–18)
CALCIUM: 8.6 mg/dL (ref 8.5–10.1)
CHLORIDE: 110 mmol/L — AB (ref 98–107)
Co2: 30 mmol/L (ref 21–32)
Creatinine: 0.96 mg/dL (ref 0.60–1.30)
EGFR (African American): >60
Glucose: 99 mg/dL (ref 65–99)
Osmolality: 292 (ref 275–301)
POTASSIUM: 3.7 mmol/L (ref 3.5–5.1)
Sodium: 146 mmol/L — ABNORMAL HIGH (ref 136–145)

## 2014-03-06 LAB — CBC CANCER CENTER
Basophil #: 0 x10 3/mm (ref 0.0–0.1)
Basophil %: 0.8 %
EOS ABS: 0.2 x10 3/mm (ref 0.0–0.7)
Eosinophil %: 3.4 %
HCT: 33 % — ABNORMAL LOW (ref 40.0–52.0)
HGB: 10.4 g/dL — AB (ref 13.0–18.0)
LYMPHS PCT: 29.2 %
Lymphocyte #: 1.6 x10 3/mm (ref 1.0–3.6)
MCH: 28.5 pg (ref 26.0–34.0)
MCHC: 31.5 g/dL — ABNORMAL LOW (ref 32.0–36.0)
MCV: 91 fL (ref 80–100)
MONO ABS: 0.8 x10 3/mm (ref 0.2–1.0)
Monocyte %: 15.2 %
NEUTROS PCT: 51.4 %
Neutrophil #: 2.8 x10 3/mm (ref 1.4–6.5)
PLATELETS: 141 x10 3/mm — AB (ref 150–440)
RBC: 3.65 10*6/uL — AB (ref 4.40–5.90)
RDW: 19.8 % — ABNORMAL HIGH (ref 11.5–14.5)
WBC: 5.5 x10 3/mm (ref 3.8–10.6)

## 2014-03-06 LAB — PROTIME-INR
INR: 2
Prothrombin Time: 22.5 secs — ABNORMAL HIGH (ref 11.5–14.7)

## 2014-03-06 LAB — MAGNESIUM: Magnesium: 1.6 mg/dL — ABNORMAL LOW

## 2014-03-06 LAB — PROTEIN, URINE, RANDOM: PROTEIN, RANDOM URINE: 58 mg/dL — AB (ref 0–12)

## 2014-03-13 LAB — PROTIME-INR
INR: 1.3
PROTHROMBIN TIME: 16.1 s — AB (ref 11.5–14.7)

## 2014-03-14 ENCOUNTER — Ambulatory Visit: Payer: Self-pay | Admitting: Oncology

## 2014-03-20 LAB — CBC CANCER CENTER
Basophil #: 0 x10 3/mm (ref 0.0–0.1)
Basophil %: 0.7 %
EOS ABS: 0.1 x10 3/mm (ref 0.0–0.7)
Eosinophil %: 1.6 %
HCT: 34.4 % — ABNORMAL LOW (ref 40.0–52.0)
HGB: 10.9 g/dL — ABNORMAL LOW (ref 13.0–18.0)
LYMPHS PCT: 27.7 %
Lymphocyte #: 1.6 x10 3/mm (ref 1.0–3.6)
MCH: 28.2 pg (ref 26.0–34.0)
MCHC: 31.6 g/dL — ABNORMAL LOW (ref 32.0–36.0)
MCV: 89 fL (ref 80–100)
Monocyte #: 0.7 x10 3/mm (ref 0.2–1.0)
Monocyte %: 12.1 %
Neutrophil #: 3.4 x10 3/mm (ref 1.4–6.5)
Neutrophil %: 57.9 %
Platelet: 144 x10 3/mm — ABNORMAL LOW (ref 150–440)
RBC: 3.85 10*6/uL — AB (ref 4.40–5.90)
RDW: 20.1 % — ABNORMAL HIGH (ref 11.5–14.5)
WBC: 5.9 x10 3/mm (ref 3.8–10.6)

## 2014-03-20 LAB — BASIC METABOLIC PANEL
ANION GAP: 8 (ref 7–16)
BUN: 10 mg/dL (ref 7–18)
CALCIUM: 9 mg/dL (ref 8.5–10.1)
CHLORIDE: 109 mmol/L — AB (ref 98–107)
Co2: 30 mmol/L (ref 21–32)
Creatinine: 0.94 mg/dL (ref 0.60–1.30)
EGFR (African American): >60
EGFR (Non-African Amer.): >60
Glucose: 103 mg/dL — ABNORMAL HIGH (ref 65–99)
Osmolality: 292 (ref 275–301)
Potassium: 2.7 mmol/L — ABNORMAL LOW (ref 3.5–5.1)
Sodium: 147 mmol/L — ABNORMAL HIGH (ref 136–145)

## 2014-03-20 LAB — URIC ACID: URIC ACID: 3.8 mg/dL (ref 3.5–7.2)

## 2014-03-20 LAB — PROTIME-INR
INR: 2
PROTHROMBIN TIME: 22 s — AB (ref 11.5–14.7)

## 2014-03-20 LAB — MAGNESIUM: Magnesium: 1.7 mg/dL — ABNORMAL LOW

## 2014-03-21 LAB — CEA: CEA: 88.3 ng/mL — ABNORMAL HIGH (ref 0.0–4.7)

## 2014-03-27 LAB — PROTIME-INR
INR: 2.5
Prothrombin Time: 26.7 secs — ABNORMAL HIGH (ref 11.5–14.7)

## 2014-04-03 LAB — CBC CANCER CENTER
BASOS PCT: 0.4 %
Basophil #: 0 x10 3/mm (ref 0.0–0.1)
EOS ABS: 0.1 x10 3/mm (ref 0.0–0.7)
Eosinophil %: 2.3 %
HCT: 31 % — ABNORMAL LOW (ref 40.0–52.0)
HGB: 9.9 g/dL — ABNORMAL LOW (ref 13.0–18.0)
LYMPHS ABS: 1.3 x10 3/mm (ref 1.0–3.6)
LYMPHS PCT: 29.2 %
MCH: 28.7 pg (ref 26.0–34.0)
MCHC: 32.1 g/dL (ref 32.0–36.0)
MCV: 89 fL (ref 80–100)
Monocyte #: 0.6 x10 3/mm (ref 0.2–1.0)
Monocyte %: 13 %
NEUTROS ABS: 2.4 x10 3/mm (ref 1.4–6.5)
Neutrophil %: 55.1 %
Platelet: 129 x10 3/mm — ABNORMAL LOW (ref 150–440)
RBC: 3.46 10*6/uL — ABNORMAL LOW (ref 4.40–5.90)
RDW: 23.2 % — AB (ref 11.5–14.5)
WBC: 4.3 x10 3/mm (ref 3.8–10.6)

## 2014-04-03 LAB — PROTEIN, URINE, RANDOM: PROTEIN, RANDOM URINE: 73 mg/dL — AB (ref 0–12)

## 2014-04-03 LAB — BASIC METABOLIC PANEL
ANION GAP: 3 — AB (ref 7–16)
BUN: 12 mg/dL (ref 7–18)
CALCIUM: 8.4 mg/dL — AB (ref 8.5–10.1)
CREATININE: 0.82 mg/dL (ref 0.60–1.30)
Chloride: 110 mmol/L — ABNORMAL HIGH (ref 98–107)
Co2: 30 mmol/L (ref 21–32)
EGFR (African American): >60
EGFR (Non-African Amer.): >60
Glucose: 96 mg/dL (ref 65–99)
OSMOLALITY: 285 (ref 275–301)
Potassium: 3 mmol/L — ABNORMAL LOW (ref 3.5–5.1)
SODIUM: 143 mmol/L (ref 136–145)

## 2014-04-03 LAB — PROTIME-INR
INR: 2.4
PROTHROMBIN TIME: 25.9 s — AB (ref 11.5–14.7)

## 2014-04-03 LAB — MAGNESIUM: MAGNESIUM: 1.6 mg/dL — AB

## 2014-04-04 LAB — CEA: CEA: 68.8 ng/mL — AB (ref 0.0–4.7)

## 2014-04-10 LAB — PROTIME-INR
INR: 1.6
PROTHROMBIN TIME: 19 s — AB (ref 11.5–14.7)

## 2014-04-13 ENCOUNTER — Ambulatory Visit: Payer: Self-pay | Admitting: Oncology

## 2014-04-17 LAB — PROTIME-INR
INR: 2.3
Prothrombin Time: 24.7 secs — ABNORMAL HIGH (ref 11.5–14.7)

## 2014-04-17 LAB — CBC CANCER CENTER
BASOS PCT: 0.5 %
Basophil #: 0 x10 3/mm (ref 0.0–0.1)
EOS PCT: 2.4 %
Eosinophil #: 0.2 x10 3/mm (ref 0.0–0.7)
HCT: 32.8 % — AB (ref 40.0–52.0)
HGB: 10.5 g/dL — AB (ref 13.0–18.0)
Lymphocyte #: 1.6 x10 3/mm (ref 1.0–3.6)
Lymphocyte %: 24.9 %
MCH: 28.5 pg (ref 26.0–34.0)
MCHC: 31.9 g/dL — ABNORMAL LOW (ref 32.0–36.0)
MCV: 90 fL (ref 80–100)
Monocyte #: 0.7 x10 3/mm (ref 0.2–1.0)
Monocyte %: 11.5 %
Neutrophil #: 3.9 x10 3/mm (ref 1.4–6.5)
Neutrophil %: 60.7 %
Platelet: 150 x10 3/mm (ref 150–440)
RBC: 3.67 10*6/uL — ABNORMAL LOW (ref 4.40–5.90)
RDW: 24.1 % — ABNORMAL HIGH (ref 11.5–14.5)
WBC: 6.4 x10 3/mm (ref 3.8–10.6)

## 2014-04-17 LAB — BASIC METABOLIC PANEL
Anion Gap: 8 (ref 7–16)
BUN: 14 mg/dL (ref 7–18)
CALCIUM: 8.1 mg/dL — AB (ref 8.5–10.1)
CHLORIDE: 111 mmol/L — AB (ref 98–107)
CO2: 29 mmol/L (ref 21–32)
CREATININE: 1.17 mg/dL (ref 0.60–1.30)
EGFR (Non-African Amer.): >60
Glucose: 111 mg/dL — ABNORMAL HIGH (ref 65–99)
Osmolality: 295 (ref 275–301)
POTASSIUM: 2.7 mmol/L — AB (ref 3.5–5.1)
Sodium: 148 mmol/L — ABNORMAL HIGH (ref 136–145)

## 2014-04-17 LAB — MAGNESIUM: MAGNESIUM: 1.8 mg/dL

## 2014-04-17 LAB — PROTEIN, URINE, RANDOM: PROTEIN, RANDOM URINE: 117 mg/dL — AB (ref 0–12)

## 2014-04-18 LAB — CEA: CEA: 65.9 ng/mL — ABNORMAL HIGH (ref 0.0–4.7)

## 2014-04-24 LAB — PROTIME-INR
INR: 1.8
PROTHROMBIN TIME: 20.6 s — AB (ref 11.5–14.7)

## 2014-05-01 LAB — CBC CANCER CENTER
BASOS PCT: 0.5 %
Basophil #: 0 x10 3/mm (ref 0.0–0.1)
Eosinophil #: 0.1 x10 3/mm (ref 0.0–0.7)
Eosinophil %: 2.5 %
HCT: 31.1 % — AB (ref 40.0–52.0)
HGB: 10.2 g/dL — ABNORMAL LOW (ref 13.0–18.0)
LYMPHS PCT: 35.8 %
Lymphocyte #: 1.7 x10 3/mm (ref 1.0–3.6)
MCH: 28.7 pg (ref 26.0–34.0)
MCHC: 32.7 g/dL (ref 32.0–36.0)
MCV: 88 fL (ref 80–100)
MONO ABS: 0.7 x10 3/mm (ref 0.2–1.0)
Monocyte %: 14 %
Neutrophil #: 2.3 x10 3/mm (ref 1.4–6.5)
Neutrophil %: 47.2 %
PLATELETS: 175 x10 3/mm (ref 150–440)
RBC: 3.55 10*6/uL — AB (ref 4.40–5.90)
RDW: 23.5 % — AB (ref 11.5–14.5)
WBC: 4.9 x10 3/mm (ref 3.8–10.6)

## 2014-05-01 LAB — BASIC METABOLIC PANEL
Anion Gap: 8 (ref 7–16)
BUN: 14 mg/dL (ref 7–18)
Calcium, Total: 8.4 mg/dL — ABNORMAL LOW (ref 8.5–10.1)
Chloride: 110 mmol/L — ABNORMAL HIGH (ref 98–107)
Co2: 27 mmol/L (ref 21–32)
Creatinine: 1.05 mg/dL (ref 0.60–1.30)
EGFR (African American): >60
GLUCOSE: 101 mg/dL — AB (ref 65–99)
Osmolality: 289 (ref 275–301)
POTASSIUM: 3.1 mmol/L — AB (ref 3.5–5.1)
SODIUM: 145 mmol/L (ref 136–145)

## 2014-05-01 LAB — PROTEIN, URINE, RANDOM: Protein, Random Urine: 156 mg/dL — ABNORMAL HIGH (ref 0–12)

## 2014-05-01 LAB — MAGNESIUM: Magnesium: 1.4 mg/dL — ABNORMAL LOW

## 2014-05-01 LAB — PROTIME-INR
INR: 2.1
PROTHROMBIN TIME: 23.4 s — AB (ref 11.5–14.7)

## 2014-05-02 LAB — CEA: CEA: 53.1 ng/mL — ABNORMAL HIGH (ref 0.0–4.7)

## 2014-05-08 LAB — PROTIME-INR
INR: 1.3
Prothrombin Time: 15.7 secs — ABNORMAL HIGH (ref 11.5–14.7)

## 2014-05-14 ENCOUNTER — Ambulatory Visit: Payer: Self-pay | Admitting: Oncology

## 2014-05-15 LAB — BASIC METABOLIC PANEL
Anion Gap: 8 (ref 7–16)
BUN: 13 mg/dL (ref 7–18)
CHLORIDE: 109 mmol/L — AB (ref 98–107)
CO2: 28 mmol/L (ref 21–32)
CREATININE: 0.96 mg/dL (ref 0.60–1.30)
Calcium, Total: 8.7 mg/dL (ref 8.5–10.1)
EGFR (African American): >60
GLUCOSE: 88 mg/dL (ref 65–99)
OSMOLALITY: 288 (ref 275–301)
Potassium: 2.8 mmol/L — ABNORMAL LOW (ref 3.5–5.1)
Sodium: 145 mmol/L (ref 136–145)

## 2014-05-15 LAB — CBC CANCER CENTER
Basophil #: 0 x10 3/mm (ref 0.0–0.1)
Basophil %: 0.8 %
EOS ABS: 0.1 x10 3/mm (ref 0.0–0.7)
EOS PCT: 2.5 %
HCT: 31 % — ABNORMAL LOW (ref 40.0–52.0)
HGB: 10 g/dL — ABNORMAL LOW (ref 13.0–18.0)
Lymphocyte #: 1.6 x10 3/mm (ref 1.0–3.6)
Lymphocyte %: 30.5 %
MCH: 28.9 pg (ref 26.0–34.0)
MCHC: 32.2 g/dL (ref 32.0–36.0)
MCV: 90 fL (ref 80–100)
MONO ABS: 0.8 x10 3/mm (ref 0.2–1.0)
MONOS PCT: 15.3 %
NEUTROS PCT: 50.9 %
Neutrophil #: 2.7 x10 3/mm (ref 1.4–6.5)
Platelet: 123 x10 3/mm — ABNORMAL LOW (ref 150–440)
RBC: 3.45 10*6/uL — ABNORMAL LOW (ref 4.40–5.90)
RDW: 23.5 % — AB (ref 11.5–14.5)
WBC: 5.4 x10 3/mm (ref 3.8–10.6)

## 2014-05-15 LAB — MAGNESIUM: Magnesium: 1.8 mg/dL

## 2014-05-15 LAB — PROTIME-INR
INR: 1.2
Prothrombin Time: 15 secs — ABNORMAL HIGH (ref 11.5–14.7)

## 2014-05-15 LAB — PROTEIN, URINE, RANDOM: Protein, Random Urine: 120 mg/dL — ABNORMAL HIGH (ref 0–12)

## 2014-05-16 LAB — CEA: CEA: 45.4 ng/mL — ABNORMAL HIGH (ref 0.0–4.7)

## 2014-05-18 ENCOUNTER — Inpatient Hospital Stay: Payer: Self-pay | Admitting: Internal Medicine

## 2014-05-18 DIAGNOSIS — I10 Essential (primary) hypertension: Secondary | ICD-10-CM

## 2014-05-18 DIAGNOSIS — R9431 Abnormal electrocardiogram [ECG] [EKG]: Secondary | ICD-10-CM

## 2014-05-18 DIAGNOSIS — R55 Syncope and collapse: Secondary | ICD-10-CM

## 2014-05-18 DIAGNOSIS — I359 Nonrheumatic aortic valve disorder, unspecified: Secondary | ICD-10-CM

## 2014-05-18 LAB — CBC
HCT: 35.4 % — ABNORMAL LOW (ref 40.0–52.0)
HGB: 11.3 g/dL — AB (ref 13.0–18.0)
MCH: 29.1 pg (ref 26.0–34.0)
MCHC: 31.9 g/dL — ABNORMAL LOW (ref 32.0–36.0)
MCV: 91 fL (ref 80–100)
PLATELETS: 125 10*3/uL — AB (ref 150–440)
RBC: 3.89 10*6/uL — ABNORMAL LOW (ref 4.40–5.90)
RDW: 24.1 % — AB (ref 11.5–14.5)
WBC: 5.7 10*3/uL (ref 3.8–10.6)

## 2014-05-18 LAB — COMPREHENSIVE METABOLIC PANEL
ALBUMIN: 3.7 g/dL (ref 3.4–5.0)
ALK PHOS: 90 U/L
ALT: 28 U/L (ref 12–78)
Anion Gap: 12 (ref 7–16)
BUN: 26 mg/dL — AB (ref 7–18)
Bilirubin,Total: 1.4 mg/dL — ABNORMAL HIGH (ref 0.2–1.0)
CALCIUM: 9.6 mg/dL (ref 8.5–10.1)
Chloride: 110 mmol/L — ABNORMAL HIGH (ref 98–107)
Co2: 22 mmol/L (ref 21–32)
Creatinine: 1.47 mg/dL — ABNORMAL HIGH (ref 0.60–1.30)
GFR CALC AF AMER: 58 — AB
GFR CALC NON AF AMER: 50 — AB
Glucose: 81 mg/dL (ref 65–99)
Osmolality: 291 (ref 275–301)
POTASSIUM: 2.8 mmol/L — AB (ref 3.5–5.1)
SGOT(AST): 38 U/L — ABNORMAL HIGH (ref 15–37)
Sodium: 144 mmol/L (ref 136–145)
Total Protein: 7.4 g/dL (ref 6.4–8.2)

## 2014-05-18 LAB — CK TOTAL AND CKMB (NOT AT ARMC)
CK, TOTAL: 41 U/L
CK, Total: 39 U/L
CK, Total: 33 U/L — ABNORMAL LOW
CK-MB: 0.9 ng/mL (ref 0.5–3.6)
CK-MB: 0.8 ng/mL (ref 0.5–3.6)
CK-MB: 0.9 ng/mL (ref 0.5–3.6)

## 2014-05-18 LAB — URINALYSIS, COMPLETE
BILIRUBIN, UR: NEGATIVE
Glucose,UR: NEGATIVE mg/dL (ref 0–75)
Ketone: NEGATIVE
Nitrite: NEGATIVE
Ph: 6 (ref 4.5–8.0)
Protein: 100
Specific Gravity: 1.013 (ref 1.003–1.030)
Squamous Epithelial: 2
WBC UR: 562 /HPF (ref 0–5)

## 2014-05-18 LAB — PROTIME-INR
INR: 1.2
PROTHROMBIN TIME: 15 s — AB (ref 11.5–14.7)

## 2014-05-18 LAB — TROPONIN I
Troponin-I: <0.02 ng/mL
Troponin-I: <0.02 ng/mL

## 2014-05-18 LAB — LIPASE, BLOOD: Lipase: 53 U/L — ABNORMAL LOW (ref 73–393)

## 2014-05-18 LAB — MAGNESIUM: Magnesium: 2 mg/dL

## 2014-05-18 LAB — ETHANOL: Ethanol: <3 mg/dL

## 2014-05-19 LAB — CBC WITH DIFFERENTIAL/PLATELET
Basophil #: 0 10*3/uL (ref 0.0–0.1)
Basophil %: 0.2 %
EOS PCT: 1.6 %
Eosinophil #: 0.1 10*3/uL (ref 0.0–0.7)
HCT: 29.3 % — ABNORMAL LOW (ref 40.0–52.0)
HGB: 9.4 g/dL — ABNORMAL LOW (ref 13.0–18.0)
Lymphocyte #: 1.6 10*3/uL (ref 1.0–3.6)
Lymphocyte %: 36.8 %
MCH: 29.1 pg (ref 26.0–34.0)
MCHC: 31.9 g/dL — ABNORMAL LOW (ref 32.0–36.0)
MCV: 91 fL (ref 80–100)
MONO ABS: 0.1 x10 3/mm — AB (ref 0.2–1.0)
Monocyte %: 2.9 %
NEUTROS ABS: 2.5 10*3/uL (ref 1.4–6.5)
Neutrophil %: 58.5 %
Platelet: 103 10*3/uL — ABNORMAL LOW (ref 150–440)
RBC: 3.22 10*6/uL — ABNORMAL LOW (ref 4.40–5.90)
RDW: 23.4 % — ABNORMAL HIGH (ref 11.5–14.5)
WBC: 4.2 10*3/uL (ref 3.8–10.6)

## 2014-05-19 LAB — PROTIME-INR
INR: 1.4
Prothrombin Time: 16.7 secs — ABNORMAL HIGH (ref 11.5–14.7)

## 2014-05-19 LAB — POTASSIUM: Potassium: 3.4 mmol/L — ABNORMAL LOW (ref 3.5–5.1)

## 2014-05-19 LAB — BASIC METABOLIC PANEL
Anion Gap: 3 — ABNORMAL LOW (ref 7–16)
BUN: 18 mg/dL (ref 7–18)
CALCIUM: 8.1 mg/dL — AB (ref 8.5–10.1)
Chloride: 113 mmol/L — ABNORMAL HIGH (ref 98–107)
Co2: 28 mmol/L (ref 21–32)
Creatinine: 1 mg/dL (ref 0.60–1.30)
Glucose: 84 mg/dL (ref 65–99)
OSMOLALITY: 288 (ref 275–301)
Potassium: 2.5 mmol/L — CL (ref 3.5–5.1)
SODIUM: 144 mmol/L (ref 136–145)

## 2014-05-19 LAB — MAGNESIUM
MAGNESIUM: 1.6 mg/dL — AB
Magnesium: 1.8 mg/dL

## 2014-05-20 LAB — PROTIME-INR
INR: 1.3
Prothrombin Time: 16.3 secs — ABNORMAL HIGH (ref 11.5–14.7)

## 2014-05-20 LAB — URINE CULTURE

## 2014-05-22 LAB — PROTIME-INR
INR: 1.2
PROTHROMBIN TIME: 14.7 s (ref 11.5–14.7)

## 2014-05-29 LAB — CBC CANCER CENTER
BASOS ABS: 0 x10 3/mm (ref 0.0–0.1)
BASOS PCT: 0.7 %
Eosinophil #: 0.1 x10 3/mm (ref 0.0–0.7)
Eosinophil %: 2 %
HCT: 29.9 % — AB (ref 40.0–52.0)
HGB: 9.7 g/dL — ABNORMAL LOW (ref 13.0–18.0)
Lymphocyte #: 1.8 x10 3/mm (ref 1.0–3.6)
Lymphocyte %: 27.8 %
MCH: 29.2 pg (ref 26.0–34.0)
MCHC: 32.4 g/dL (ref 32.0–36.0)
MCV: 90 fL (ref 80–100)
MONO ABS: 0.9 x10 3/mm (ref 0.2–1.0)
Monocyte %: 14.6 %
NEUTROS PCT: 54.9 %
Neutrophil #: 3.6 x10 3/mm (ref 1.4–6.5)
PLATELETS: 166 x10 3/mm (ref 150–440)
RBC: 3.32 10*6/uL — ABNORMAL LOW (ref 4.40–5.90)
RDW: 23.9 % — AB (ref 11.5–14.5)
WBC: 6.5 x10 3/mm (ref 3.8–10.6)

## 2014-05-29 LAB — BASIC METABOLIC PANEL
Anion Gap: 8 (ref 7–16)
BUN: 14 mg/dL (ref 7–18)
CALCIUM: 8.7 mg/dL (ref 8.5–10.1)
CHLORIDE: 109 mmol/L — AB (ref 98–107)
CO2: 28 mmol/L (ref 21–32)
Creatinine: 0.99 mg/dL (ref 0.60–1.30)
EGFR (African American): >60
EGFR (Non-African Amer.): >60
GLUCOSE: 95 mg/dL (ref 65–99)
Osmolality: 289 (ref 275–301)
POTASSIUM: 2.8 mmol/L — AB (ref 3.5–5.1)
Sodium: 145 mmol/L (ref 136–145)

## 2014-05-29 LAB — PROTEIN, URINE, RANDOM: PROTEIN, RANDOM URINE: 43 mg/dL — AB (ref 0–12)

## 2014-05-29 LAB — PROTIME-INR
INR: 1.9
Prothrombin Time: 21.3 secs — ABNORMAL HIGH (ref 11.5–14.7)

## 2014-05-29 LAB — MAGNESIUM: Magnesium: 1.4 mg/dL — ABNORMAL LOW

## 2014-05-30 LAB — CEA: CEA: 46.2 ng/mL — ABNORMAL HIGH (ref 0.0–4.7)

## 2014-06-05 LAB — PROTIME-INR
INR: 2.3
PROTHROMBIN TIME: 24.7 s — AB (ref 11.5–14.7)

## 2014-06-13 ENCOUNTER — Ambulatory Visit: Payer: Self-pay | Admitting: Oncology

## 2014-06-13 ENCOUNTER — Ambulatory Visit: Payer: Self-pay | Admitting: Family Medicine

## 2014-06-14 ENCOUNTER — Ambulatory Visit: Payer: Self-pay | Admitting: Oncology

## 2014-06-14 LAB — COMPREHENSIVE METABOLIC PANEL
ALBUMIN: 3.4 g/dL (ref 3.4–5.0)
ALT: 18 U/L (ref 12–78)
Alkaline Phosphatase: 103 U/L
Anion Gap: 9 (ref 7–16)
BUN: 10 mg/dL (ref 7–18)
Bilirubin,Total: 1 mg/dL (ref 0.2–1.0)
CHLORIDE: 107 mmol/L (ref 98–107)
CO2: 30 mmol/L (ref 21–32)
Calcium, Total: 8.9 mg/dL (ref 8.5–10.1)
Creatinine: 1.05 mg/dL (ref 0.60–1.30)
EGFR (African American): >60
Glucose: 96 mg/dL (ref 65–99)
Osmolality: 289 (ref 275–301)
Potassium: 3 mmol/L — ABNORMAL LOW (ref 3.5–5.1)
SGOT(AST): 14 U/L — ABNORMAL LOW (ref 15–37)
Sodium: 146 mmol/L — ABNORMAL HIGH (ref 136–145)
TOTAL PROTEIN: 7.3 g/dL (ref 6.4–8.2)

## 2014-06-14 LAB — CBC CANCER CENTER
BASOS PCT: 0.5 %
Basophil #: 0 x10 3/mm (ref 0.0–0.1)
EOS ABS: 0.1 x10 3/mm (ref 0.0–0.7)
EOS PCT: 1.2 %
HCT: 32.9 % — AB (ref 40.0–52.0)
HGB: 10.6 g/dL — ABNORMAL LOW (ref 13.0–18.0)
LYMPHS ABS: 1 x10 3/mm (ref 1.0–3.6)
Lymphocyte %: 16.4 %
MCH: 28.9 pg (ref 26.0–34.0)
MCHC: 32.2 g/dL (ref 32.0–36.0)
MCV: 90 fL (ref 80–100)
Monocyte #: 0.7 x10 3/mm (ref 0.2–1.0)
Monocyte %: 12.1 %
NEUTROS ABS: 4.3 x10 3/mm (ref 1.4–6.5)
Neutrophil %: 69.8 %
Platelet: 161 x10 3/mm (ref 150–440)
RBC: 3.66 10*6/uL — ABNORMAL LOW (ref 4.40–5.90)
RDW: 22.7 % — AB (ref 11.5–14.5)
WBC: 6.1 x10 3/mm (ref 3.8–10.6)

## 2014-06-18 LAB — CEA: CEA: 49.3 ng/mL — ABNORMAL HIGH (ref 0.0–4.7)

## 2014-06-26 LAB — COMPREHENSIVE METABOLIC PANEL
Albumin: 3 g/dL — ABNORMAL LOW (ref 3.4–5.0)
Alkaline Phosphatase: 97 U/L
Anion Gap: 7 (ref 7–16)
BILIRUBIN TOTAL: 0.4 mg/dL (ref 0.2–1.0)
BUN: 10 mg/dL (ref 7–18)
CALCIUM: 8.6 mg/dL (ref 8.5–10.1)
CO2: 30 mmol/L (ref 21–32)
CREATININE: 0.84 mg/dL (ref 0.60–1.30)
Chloride: 107 mmol/L (ref 98–107)
EGFR (African American): >60
EGFR (Non-African Amer.): >60
Glucose: 91 mg/dL (ref 65–99)
OSMOLALITY: 285 (ref 275–301)
Potassium: 3.2 mmol/L — ABNORMAL LOW (ref 3.5–5.1)
SGOT(AST): 21 U/L (ref 15–37)
SGPT (ALT): 14 U/L (ref 12–78)
SODIUM: 144 mmol/L (ref 136–145)
Total Protein: 7 g/dL (ref 6.4–8.2)

## 2014-06-26 LAB — PROTIME-INR
INR: 1.4
Prothrombin Time: 16.5 secs — ABNORMAL HIGH (ref 11.5–14.7)

## 2014-06-26 LAB — CBC CANCER CENTER
Basophil #: 0.1 x10 3/mm (ref 0.0–0.1)
Basophil %: 0.9 %
EOS ABS: 0.2 x10 3/mm (ref 0.0–0.7)
Eosinophil %: 3.3 %
HCT: 33.7 % — AB (ref 40.0–52.0)
HGB: 10.8 g/dL — ABNORMAL LOW (ref 13.0–18.0)
LYMPHS ABS: 1.8 x10 3/mm (ref 1.0–3.6)
Lymphocyte %: 27.1 %
MCH: 29.3 pg (ref 26.0–34.0)
MCHC: 32.1 g/dL (ref 32.0–36.0)
MCV: 91 fL (ref 80–100)
Monocyte #: 1.1 x10 3/mm — ABNORMAL HIGH (ref 0.2–1.0)
Monocyte %: 16.4 %
NEUTROS ABS: 3.5 x10 3/mm (ref 1.4–6.5)
Neutrophil %: 52.3 %
Platelet: 252 x10 3/mm (ref 150–440)
RBC: 3.69 10*6/uL — AB (ref 4.40–5.90)
RDW: 22.3 % — ABNORMAL HIGH (ref 11.5–14.5)
WBC: 6.7 x10 3/mm (ref 3.8–10.6)

## 2014-06-26 LAB — MAGNESIUM: MAGNESIUM: 1.7 mg/dL — AB

## 2014-07-14 ENCOUNTER — Ambulatory Visit: Payer: Self-pay | Admitting: Oncology

## 2014-07-17 LAB — COMPREHENSIVE METABOLIC PANEL
ALBUMIN: 3 g/dL — AB (ref 3.4–5.0)
ALK PHOS: 104 U/L
AST: 26 U/L (ref 15–37)
Anion Gap: 8 (ref 7–16)
BUN: 15 mg/dL (ref 7–18)
Bilirubin,Total: 0.4 mg/dL (ref 0.2–1.0)
CALCIUM: 8.3 mg/dL — AB (ref 8.5–10.1)
CHLORIDE: 109 mmol/L — AB (ref 98–107)
CREATININE: 0.99 mg/dL (ref 0.60–1.30)
Co2: 28 mmol/L (ref 21–32)
EGFR (African American): >60
EGFR (Non-African Amer.): >60
Glucose: 98 mg/dL (ref 65–99)
Osmolality: 290 (ref 275–301)
POTASSIUM: 3 mmol/L — AB (ref 3.5–5.1)
SGPT (ALT): 25 U/L
Sodium: 145 mmol/L (ref 136–145)
Total Protein: 7.1 g/dL (ref 6.4–8.2)

## 2014-07-17 LAB — CBC CANCER CENTER
BASOS ABS: 0 x10 3/mm (ref 0.0–0.1)
Basophil %: 0.7 %
Eosinophil #: 0.4 x10 3/mm (ref 0.0–0.7)
Eosinophil %: 8.3 %
HCT: 32.4 % — AB (ref 40.0–52.0)
HGB: 10.4 g/dL — ABNORMAL LOW (ref 13.0–18.0)
LYMPHS ABS: 1.4 x10 3/mm (ref 1.0–3.6)
LYMPHS PCT: 25.3 %
MCH: 30 pg (ref 26.0–34.0)
MCHC: 32.1 g/dL (ref 32.0–36.0)
MCV: 93 fL (ref 80–100)
MONOS PCT: 16.9 %
Monocyte #: 0.9 x10 3/mm (ref 0.2–1.0)
NEUTROS PCT: 48.8 %
Neutrophil #: 2.6 x10 3/mm (ref 1.4–6.5)
PLATELETS: 139 x10 3/mm — AB (ref 150–440)
RBC: 3.47 10*6/uL — AB (ref 4.40–5.90)
RDW: 23 % — AB (ref 11.5–14.5)
WBC: 5.4 x10 3/mm (ref 3.8–10.6)

## 2014-08-07 LAB — CBC CANCER CENTER
Basophil #: 0.1 x10 3/mm (ref 0.0–0.1)
Basophil %: 1.1 %
EOS PCT: 4.4 %
Eosinophil #: 0.2 x10 3/mm (ref 0.0–0.7)
HCT: 34.6 % — AB (ref 40.0–52.0)
HGB: 10.9 g/dL — ABNORMAL LOW (ref 13.0–18.0)
Lymphocyte #: 1.3 x10 3/mm (ref 1.0–3.6)
Lymphocyte %: 24.8 %
MCH: 29.2 pg (ref 26.0–34.0)
MCHC: 31.4 g/dL — AB (ref 32.0–36.0)
MCV: 93 fL (ref 80–100)
MONOS PCT: 24.2 %
Monocyte #: 1.2 x10 3/mm — ABNORMAL HIGH (ref 0.2–1.0)
Neutrophil #: 2.3 x10 3/mm (ref 1.4–6.5)
Neutrophil %: 45.5 %
Platelet: 158 x10 3/mm (ref 150–440)
RBC: 3.73 10*6/uL — ABNORMAL LOW (ref 4.40–5.90)
RDW: 22.7 % — AB (ref 11.5–14.5)
WBC: 5.1 x10 3/mm (ref 3.8–10.6)

## 2014-08-07 LAB — BASIC METABOLIC PANEL
ANION GAP: 10 (ref 7–16)
BUN: 8 mg/dL (ref 7–18)
CALCIUM: 8.3 mg/dL — AB (ref 8.5–10.1)
CHLORIDE: 105 mmol/L (ref 98–107)
Co2: 27 mmol/L (ref 21–32)
Creatinine: 0.83 mg/dL (ref 0.60–1.30)
EGFR (African American): >60
EGFR (Non-African Amer.): >60
Glucose: 98 mg/dL (ref 65–99)
Osmolality: 281 (ref 275–301)
POTASSIUM: 2.8 mmol/L — AB (ref 3.5–5.1)
SODIUM: 142 mmol/L (ref 136–145)

## 2014-08-07 LAB — PROTEIN, URINE, RANDOM: Protein, Random Urine: 40 mg/dL — ABNORMAL HIGH (ref 0–12)

## 2014-08-08 LAB — CEA: CEA: 124.7 ng/mL — AB (ref 0.0–4.7)

## 2014-08-14 ENCOUNTER — Ambulatory Visit: Payer: Self-pay | Admitting: Oncology

## 2014-08-28 LAB — CBC CANCER CENTER
BASOS PCT: 0.6 %
Basophil #: 0 x10 3/mm (ref 0.0–0.1)
Eosinophil #: 0.4 x10 3/mm (ref 0.0–0.7)
Eosinophil %: 6.5 %
HCT: 36.1 % — ABNORMAL LOW (ref 40.0–52.0)
HGB: 11.5 g/dL — ABNORMAL LOW (ref 13.0–18.0)
Lymphocyte #: 1.6 x10 3/mm (ref 1.0–3.6)
Lymphocyte %: 27.6 %
MCH: 29.6 pg (ref 26.0–34.0)
MCHC: 31.9 g/dL — ABNORMAL LOW (ref 32.0–36.0)
MCV: 93 fL (ref 80–100)
MONO ABS: 0.8 x10 3/mm (ref 0.2–1.0)
MONOS PCT: 14.3 %
Neutrophil #: 2.9 x10 3/mm (ref 1.4–6.5)
Neutrophil %: 51 %
Platelet: 186 x10 3/mm (ref 150–440)
RBC: 3.89 10*6/uL — ABNORMAL LOW (ref 4.40–5.90)
RDW: 22.3 % — ABNORMAL HIGH (ref 11.5–14.5)
WBC: 5.8 x10 3/mm (ref 3.8–10.6)

## 2014-08-28 LAB — BASIC METABOLIC PANEL
Anion Gap: 8 (ref 7–16)
BUN: 13 mg/dL (ref 7–18)
CHLORIDE: 107 mmol/L (ref 98–107)
CO2: 27 mmol/L (ref 21–32)
Calcium, Total: 8.7 mg/dL (ref 8.5–10.1)
Creatinine: 0.92 mg/dL (ref 0.60–1.30)
Glucose: 101 mg/dL — ABNORMAL HIGH (ref 65–99)
OSMOLALITY: 283 (ref 275–301)
Potassium: 3.1 mmol/L — ABNORMAL LOW (ref 3.5–5.1)
Sodium: 142 mmol/L (ref 136–145)

## 2014-08-28 LAB — PROTIME-INR
INR: 1.7
PROTHROMBIN TIME: 19.5 s — AB (ref 11.5–14.7)

## 2014-08-28 LAB — PROTEIN, URINE, RANDOM: PROTEIN, RANDOM URINE: 53 mg/dL — AB (ref 0–12)

## 2014-08-28 LAB — MAGNESIUM: Magnesium: 1.6 mg/dL — ABNORMAL LOW

## 2014-08-29 LAB — CEA: CEA: 144.4 ng/mL — ABNORMAL HIGH (ref 0.0–4.7)

## 2014-09-13 ENCOUNTER — Ambulatory Visit: Payer: Self-pay | Admitting: Oncology

## 2014-09-18 LAB — COMPREHENSIVE METABOLIC PANEL
ALT: 20 U/L
AST: 21 U/L (ref 15–37)
Albumin: 3.1 g/dL — ABNORMAL LOW (ref 3.4–5.0)
Alkaline Phosphatase: 107 U/L
Anion Gap: 5 — ABNORMAL LOW (ref 7–16)
BUN: 12 mg/dL (ref 7–18)
Bilirubin,Total: 0.4 mg/dL (ref 0.2–1.0)
CHLORIDE: 109 mmol/L — AB (ref 98–107)
CO2: 30 mmol/L (ref 21–32)
Calcium, Total: 8.9 mg/dL (ref 8.5–10.1)
Creatinine: 0.94 mg/dL (ref 0.60–1.30)
EGFR (African American): >60
EGFR (Non-African Amer.): >60
Glucose: 104 mg/dL — ABNORMAL HIGH (ref 65–99)
Osmolality: 287 (ref 275–301)
POTASSIUM: 3.7 mmol/L (ref 3.5–5.1)
Sodium: 144 mmol/L (ref 136–145)
TOTAL PROTEIN: 6.7 g/dL (ref 6.4–8.2)

## 2014-09-18 LAB — CBC CANCER CENTER
Basophil #: 0.1 x10 3/mm (ref 0.0–0.1)
Basophil %: 1.7 %
Eosinophil #: 0.3 x10 3/mm (ref 0.0–0.7)
Eosinophil %: 7.6 %
HCT: 35.3 % — ABNORMAL LOW (ref 40.0–52.0)
HGB: 11.1 g/dL — AB (ref 13.0–18.0)
Lymphocyte #: 1.8 x10 3/mm (ref 1.0–3.6)
Lymphocyte %: 46.8 %
MCH: 29 pg (ref 26.0–34.0)
MCHC: 31.4 g/dL — ABNORMAL LOW (ref 32.0–36.0)
MCV: 92 fL (ref 80–100)
MONOS PCT: 26.7 %
Monocyte #: 1 x10 3/mm (ref 0.2–1.0)
Neutrophil #: 0.7 x10 3/mm — ABNORMAL LOW (ref 1.4–6.5)
Neutrophil %: 17.2 %
Platelet: 182 x10 3/mm (ref 150–440)
RBC: 3.82 10*6/uL — AB (ref 4.40–5.90)
RDW: 22.5 % — ABNORMAL HIGH (ref 11.5–14.5)
WBC: 3.9 x10 3/mm (ref 3.8–10.6)

## 2014-09-18 LAB — MAGNESIUM: MAGNESIUM: 1.8 mg/dL

## 2014-09-25 LAB — COMPREHENSIVE METABOLIC PANEL
ALBUMIN: 3 g/dL — AB (ref 3.4–5.0)
Alkaline Phosphatase: 116 U/L
Anion Gap: 6 — ABNORMAL LOW (ref 7–16)
BILIRUBIN TOTAL: 0.5 mg/dL (ref 0.2–1.0)
BUN: 11 mg/dL (ref 7–18)
CREATININE: 0.89 mg/dL (ref 0.60–1.30)
Calcium, Total: 8.6 mg/dL (ref 8.5–10.1)
Chloride: 106 mmol/L (ref 98–107)
Co2: 29 mmol/L (ref 21–32)
EGFR (Non-African Amer.): >60
Glucose: 86 mg/dL (ref 65–99)
Osmolality: 280 (ref 275–301)
POTASSIUM: 3.4 mmol/L — AB (ref 3.5–5.1)
SGOT(AST): 22 U/L (ref 15–37)
SGPT (ALT): 19 U/L
Sodium: 141 mmol/L (ref 136–145)
TOTAL PROTEIN: 7 g/dL (ref 6.4–8.2)

## 2014-09-25 LAB — CBC CANCER CENTER
BASOS PCT: 0.8 %
Basophil #: 0.1 x10 3/mm (ref 0.0–0.1)
Eosinophil #: 0.2 x10 3/mm (ref 0.0–0.7)
Eosinophil %: 2.4 %
HCT: 37.5 % — ABNORMAL LOW (ref 40.0–52.0)
HGB: 12 g/dL — AB (ref 13.0–18.0)
Lymphocyte #: 2 x10 3/mm (ref 1.0–3.6)
Lymphocyte %: 30.2 %
MCH: 29.2 pg (ref 26.0–34.0)
MCHC: 32.1 g/dL (ref 32.0–36.0)
MCV: 91 fL (ref 80–100)
Monocyte #: 1.4 x10 3/mm — ABNORMAL HIGH (ref 0.2–1.0)
Monocyte %: 20.9 %
Neutrophil #: 3 x10 3/mm (ref 1.4–6.5)
Neutrophil %: 45.7 %
Platelet: 162 x10 3/mm (ref 150–440)
RBC: 4.13 10*6/uL — ABNORMAL LOW (ref 4.40–5.90)
RDW: 22.3 % — AB (ref 11.5–14.5)
WBC: 6.6 x10 3/mm (ref 3.8–10.6)

## 2014-09-25 LAB — MAGNESIUM: Magnesium: 1.7 mg/dL — ABNORMAL LOW

## 2014-09-26 LAB — CEA: CEA: 178.1 ng/mL — ABNORMAL HIGH (ref 0.0–4.7)

## 2014-10-14 ENCOUNTER — Ambulatory Visit: Payer: Self-pay | Admitting: Oncology

## 2014-10-16 LAB — COMPREHENSIVE METABOLIC PANEL
Albumin: 3.4 g/dL (ref 3.4–5.0)
Alkaline Phosphatase: 122 U/L — ABNORMAL HIGH
Anion Gap: 10 (ref 7–16)
BUN: 16 mg/dL (ref 7–18)
Bilirubin,Total: 0.9 mg/dL (ref 0.2–1.0)
Calcium, Total: 8.6 mg/dL (ref 8.5–10.1)
Chloride: 106 mmol/L (ref 98–107)
Co2: 28 mmol/L (ref 21–32)
Creatinine: 1.06 mg/dL (ref 0.60–1.30)
EGFR (African American): >60
EGFR (Non-African Amer.): >60
GLUCOSE: 117 mg/dL — AB (ref 65–99)
Osmolality: 289 (ref 275–301)
POTASSIUM: 2.6 mmol/L — AB (ref 3.5–5.1)
SGOT(AST): 21 U/L (ref 15–37)
SGPT (ALT): 18 U/L
Sodium: 144 mmol/L (ref 136–145)
Total Protein: 7.5 g/dL (ref 6.4–8.2)

## 2014-10-16 LAB — CBC CANCER CENTER
Basophil #: 0.1 x10 3/mm (ref 0.0–0.1)
Basophil %: 0.7 %
EOS PCT: 3.4 %
Eosinophil #: 0.5 x10 3/mm (ref 0.0–0.7)
HCT: 39.6 % — ABNORMAL LOW (ref 40.0–52.0)
HGB: 12.6 g/dL — ABNORMAL LOW (ref 13.0–18.0)
LYMPHS PCT: 13.7 %
Lymphocyte #: 1.9 x10 3/mm (ref 1.0–3.6)
MCH: 29.3 pg (ref 26.0–34.0)
MCHC: 31.9 g/dL — ABNORMAL LOW (ref 32.0–36.0)
MCV: 92 fL (ref 80–100)
MONO ABS: 2 x10 3/mm — AB (ref 0.2–1.0)
MONOS PCT: 15 %
Neutrophil #: 9.1 x10 3/mm — ABNORMAL HIGH (ref 1.4–6.5)
Neutrophil %: 67.2 %
Platelet: 207 x10 3/mm (ref 150–440)
RBC: 4.32 10*6/uL — ABNORMAL LOW (ref 4.40–5.90)
RDW: 22.2 % — ABNORMAL HIGH (ref 11.5–14.5)
WBC: 13.6 x10 3/mm — AB (ref 3.8–10.6)

## 2014-10-16 LAB — MAGNESIUM: Magnesium: 1.7 mg/dL — ABNORMAL LOW

## 2014-10-17 LAB — CEA: CEA: 162.9 ng/mL — AB (ref 0.0–4.7)

## 2014-11-06 LAB — CBC CANCER CENTER
BASOS PCT: 1.3 %
Basophil #: 0.1 x10 3/mm (ref 0.0–0.1)
Eosinophil #: 0.2 x10 3/mm (ref 0.0–0.7)
Eosinophil %: 5.6 %
HCT: 37.2 % — ABNORMAL LOW (ref 40.0–52.0)
HGB: 12.1 g/dL — ABNORMAL LOW (ref 13.0–18.0)
LYMPHS ABS: 1.7 x10 3/mm (ref 1.0–3.6)
Lymphocyte %: 40 %
MCH: 30.2 pg (ref 26.0–34.0)
MCHC: 32.6 g/dL (ref 32.0–36.0)
MCV: 93 fL (ref 80–100)
Monocyte #: 1 x10 3/mm (ref 0.2–1.0)
Monocyte %: 23.3 %
Neutrophil #: 1.3 x10 3/mm — ABNORMAL LOW (ref 1.4–6.5)
Neutrophil %: 29.8 %
Platelet: 136 x10 3/mm — ABNORMAL LOW (ref 150–440)
RBC: 4.02 10*6/uL — AB (ref 4.40–5.90)
RDW: 24 % — ABNORMAL HIGH (ref 11.5–14.5)
WBC: 4.4 x10 3/mm (ref 3.8–10.6)

## 2014-11-06 LAB — COMPREHENSIVE METABOLIC PANEL
ALT: 23 U/L
AST: 25 U/L (ref 15–37)
Albumin: 3.1 g/dL — ABNORMAL LOW (ref 3.4–5.0)
Alkaline Phosphatase: 110 U/L
Anion Gap: 5 — ABNORMAL LOW (ref 7–16)
BUN: 15 mg/dL (ref 7–18)
Bilirubin,Total: 0.6 mg/dL (ref 0.2–1.0)
Calcium, Total: 8.7 mg/dL (ref 8.5–10.1)
Chloride: 107 mmol/L (ref 98–107)
Co2: 31 mmol/L (ref 21–32)
Creatinine: 0.88 mg/dL (ref 0.60–1.30)
EGFR (Non-African Amer.): >60
Glucose: 97 mg/dL (ref 65–99)
Osmolality: 286 (ref 275–301)
POTASSIUM: 2.8 mmol/L — AB (ref 3.5–5.1)
Sodium: 143 mmol/L (ref 136–145)
TOTAL PROTEIN: 6.8 g/dL (ref 6.4–8.2)

## 2014-11-07 LAB — CEA: CEA: 130.5 ng/mL — ABNORMAL HIGH (ref 0.0–4.7)

## 2014-11-13 ENCOUNTER — Ambulatory Visit: Payer: Self-pay | Admitting: Oncology

## 2014-11-27 LAB — CBC CANCER CENTER
BASOS ABS: 0.1 x10 3/mm (ref 0.0–0.1)
Basophil %: 1.4 %
Eosinophil #: 0.2 x10 3/mm (ref 0.0–0.7)
Eosinophil %: 4.4 %
HCT: 34.4 % — AB (ref 40.0–52.0)
HGB: 11.2 g/dL — ABNORMAL LOW (ref 13.0–18.0)
LYMPHS PCT: 40.2 %
Lymphocyte #: 1.6 x10 3/mm (ref 1.0–3.6)
MCH: 30.6 pg (ref 26.0–34.0)
MCHC: 32.6 g/dL (ref 32.0–36.0)
MCV: 94 fL (ref 80–100)
Monocyte #: 1 x10 3/mm (ref 0.2–1.0)
Monocyte %: 25.7 %
NEUTROS PCT: 28.3 %
Neutrophil #: 1.1 x10 3/mm — ABNORMAL LOW (ref 1.4–6.5)
PLATELETS: 158 x10 3/mm (ref 150–440)
RBC: 3.67 10*6/uL — AB (ref 4.40–5.90)
RDW: 25.3 % — ABNORMAL HIGH (ref 11.5–14.5)
WBC: 4 x10 3/mm (ref 3.8–10.6)

## 2014-11-27 LAB — COMPREHENSIVE METABOLIC PANEL
ALBUMIN: 2.9 g/dL — AB (ref 3.4–5.0)
ALT: 27 U/L
AST: 30 U/L (ref 15–37)
Alkaline Phosphatase: 114 U/L
Anion Gap: 6 — ABNORMAL LOW (ref 7–16)
BILIRUBIN TOTAL: 0.6 mg/dL (ref 0.2–1.0)
BUN: 12 mg/dL (ref 7–18)
CHLORIDE: 107 mmol/L (ref 98–107)
CREATININE: 0.75 mg/dL (ref 0.60–1.30)
Calcium, Total: 8 mg/dL — ABNORMAL LOW (ref 8.5–10.1)
Co2: 31 mmol/L (ref 21–32)
EGFR (African American): >60
EGFR (Non-African Amer.): >60
Glucose: 94 mg/dL (ref 65–99)
Osmolality: 286 (ref 275–301)
POTASSIUM: 2.8 mmol/L — AB (ref 3.5–5.1)
Sodium: 144 mmol/L (ref 136–145)
Total Protein: 6.6 g/dL (ref 6.4–8.2)

## 2014-11-28 LAB — CEA: CEA: 150.3 ng/mL — ABNORMAL HIGH (ref 0.0–4.7)

## 2014-12-14 ENCOUNTER — Ambulatory Visit: Payer: Self-pay | Admitting: Oncology

## 2014-12-18 LAB — COMPREHENSIVE METABOLIC PANEL
ALBUMIN: 3.2 g/dL — AB (ref 3.4–5.0)
ALT: 30 U/L
AST: 36 U/L (ref 15–37)
Alkaline Phosphatase: 126 U/L — ABNORMAL HIGH
Anion Gap: 9 (ref 7–16)
BUN: 12 mg/dL (ref 7–18)
Bilirubin,Total: 0.8 mg/dL (ref 0.2–1.0)
CREATININE: 0.91 mg/dL (ref 0.60–1.30)
Calcium, Total: 8.5 mg/dL (ref 8.5–10.1)
Chloride: 104 mmol/L (ref 98–107)
Co2: 31 mmol/L (ref 21–32)
EGFR (African American): >60
EGFR (Non-African Amer.): >60
GLUCOSE: 122 mg/dL — AB (ref 65–99)
Osmolality: 288 (ref 275–301)
POTASSIUM: 2.8 mmol/L — AB (ref 3.5–5.1)
Sodium: 144 mmol/L (ref 136–145)
Total Protein: 7.1 g/dL (ref 6.4–8.2)

## 2014-12-18 LAB — CBC CANCER CENTER
BASOS PCT: 0.6 %
Basophil #: 0.1 x10 3/mm (ref 0.0–0.1)
Eosinophil #: 0.3 x10 3/mm (ref 0.0–0.7)
Eosinophil %: 2.2 %
HCT: 38.8 % — ABNORMAL LOW (ref 40.0–52.0)
HGB: 12.5 g/dL — AB (ref 13.0–18.0)
LYMPHS PCT: 13.9 %
Lymphocyte #: 1.6 x10 3/mm (ref 1.0–3.6)
MCH: 31.3 pg (ref 26.0–34.0)
MCHC: 32.2 g/dL (ref 32.0–36.0)
MCV: 97 fL (ref 80–100)
MONOS PCT: 12.1 %
Monocyte #: 1.4 x10 3/mm — ABNORMAL HIGH (ref 0.2–1.0)
NEUTROS ABS: 8.3 x10 3/mm — AB (ref 1.4–6.5)
NEUTROS PCT: 71.2 %
Platelet: 144 x10 3/mm — ABNORMAL LOW (ref 150–440)
RBC: 3.99 10*6/uL — AB (ref 4.40–5.90)
RDW: 23.9 % — ABNORMAL HIGH (ref 11.5–14.5)
WBC: 11.6 x10 3/mm — ABNORMAL HIGH (ref 3.8–10.6)

## 2014-12-19 LAB — CEA: CEA: 184.4 ng/mL — ABNORMAL HIGH (ref 0.0–4.7)

## 2015-01-08 LAB — COMPREHENSIVE METABOLIC PANEL
ANION GAP: 11 (ref 7–16)
Albumin: 3 g/dL — ABNORMAL LOW (ref 3.4–5.0)
Alkaline Phosphatase: 144 U/L — ABNORMAL HIGH (ref 46–116)
BILIRUBIN TOTAL: 1.5 mg/dL — AB (ref 0.2–1.0)
BUN: 17 mg/dL (ref 7–18)
CHLORIDE: 104 mmol/L (ref 98–107)
CO2: 28 mmol/L (ref 21–32)
CREATININE: 1.06 mg/dL (ref 0.60–1.30)
Calcium, Total: 8.3 mg/dL — ABNORMAL LOW (ref 8.5–10.1)
EGFR (African American): >60
EGFR (Non-African Amer.): >60
Glucose: 131 mg/dL — ABNORMAL HIGH (ref 65–99)
Osmolality: 288 (ref 275–301)
Potassium: 2.6 mmol/L — ABNORMAL LOW (ref 3.5–5.1)
SGOT(AST): 46 U/L — ABNORMAL HIGH (ref 15–37)
SGPT (ALT): 34 U/L (ref 14–63)
Sodium: 143 mmol/L (ref 136–145)
TOTAL PROTEIN: 7.4 g/dL (ref 6.4–8.2)

## 2015-01-08 LAB — CBC CANCER CENTER
BASOS ABS: 0.1 x10 3/mm (ref 0.0–0.1)
BASOS PCT: 0.7 %
Eosinophil #: 0 x10 3/mm (ref 0.0–0.7)
Eosinophil %: 0 %
HCT: 36.4 % — AB (ref 40.0–52.0)
HGB: 12.1 g/dL — AB (ref 13.0–18.0)
LYMPHS ABS: 0.5 x10 3/mm — AB (ref 1.0–3.6)
LYMPHS PCT: 5.7 %
MCH: 32.7 pg (ref 26.0–34.0)
MCHC: 33.3 g/dL (ref 32.0–36.0)
MCV: 98 fL (ref 80–100)
Monocyte #: 2 x10 3/mm — ABNORMAL HIGH (ref 0.2–1.0)
Monocyte %: 21.7 %
NEUTROS ABS: 6.5 x10 3/mm (ref 1.4–6.5)
Neutrophil %: 71.9 %
Platelet: 116 x10 3/mm — ABNORMAL LOW (ref 150–440)
RBC: 3.71 10*6/uL — ABNORMAL LOW (ref 4.40–5.90)
RDW: 20.3 % — ABNORMAL HIGH (ref 11.5–14.5)
WBC: 9 x10 3/mm (ref 3.8–10.6)

## 2015-01-09 LAB — CEA: CEA: 252.9 ng/mL — ABNORMAL HIGH (ref 0.0–4.7)

## 2015-01-14 ENCOUNTER — Ambulatory Visit: Payer: Self-pay | Admitting: Oncology

## 2015-02-04 ENCOUNTER — Inpatient Hospital Stay: Payer: Self-pay | Admitting: Internal Medicine

## 2015-02-12 ENCOUNTER — Ambulatory Visit
Admit: 2015-02-12 | Disposition: A | Discharge status: Home / Self Care | Payer: Self-pay | Attending: Oncology | Admitting: Oncology

## 2015-03-08 LAB — CEA: CEA: 415.5 ng/mL — AB (ref 0.0–4.7)

## 2015-03-15 ENCOUNTER — Ambulatory Visit
Admit: 2015-03-15 | Disposition: A | Discharge status: Home / Self Care | Payer: Self-pay | Attending: Oncology | Admitting: Oncology

## 2015-03-28 ENCOUNTER — Emergency Department
Admit: 2015-03-28 | Disposition: A | Discharge status: Home / Self Care | Payer: Self-pay | Admitting: Emergency Medicine

## 2015-03-28 LAB — URINALYSIS, COMPLETE
Bacteria: NONE SEEN
Bilirubin,UR: NEGATIVE
Blood: NEGATIVE
Glucose,UR: NEGATIVE mg/dL (ref 0–75)
Ketone: NEGATIVE
Leukocyte Esterase: NEGATIVE
Nitrite: NEGATIVE
Ph: 6 (ref 4.5–8.0)
Protein: NEGATIVE
Specific Gravity: 1.013 (ref 1.003–1.030)

## 2015-03-28 LAB — COMPREHENSIVE METABOLIC PANEL
Albumin: 2.9 g/dL — ABNORMAL LOW
Alkaline Phosphatase: 132 U/L — ABNORMAL HIGH
Anion Gap: 7 (ref 7–16)
BUN: 29 mg/dL — ABNORMAL HIGH
Bilirubin,Total: 1.8 mg/dL — ABNORMAL HIGH
Calcium, Total: 7.9 mg/dL — ABNORMAL LOW
Chloride: 108 mmol/L
Co2: 29 mmol/L
Creatinine: 0.57 mg/dL — ABNORMAL LOW
EGFR (African American): >60
EGFR (Non-African Amer.): >60
Glucose: 97 mg/dL
Potassium: 2.5 mmol/L — CL
SGOT(AST): 36 U/L
SGPT (ALT): 49 U/L
Sodium: 144 mmol/L
Total Protein: 6.4 g/dL — ABNORMAL LOW

## 2015-03-28 LAB — CBC
HCT: 45.3 % (ref 40.0–52.0)
HGB: 15.3 g/dL (ref 13.0–18.0)
MCH: 33.1 pg (ref 26.0–34.0)
MCHC: 33.7 g/dL (ref 32.0–36.0)
MCV: 98 fL (ref 80–100)
PLATELETS: 45 10*3/uL — AB (ref 150–440)
RBC: 4.61 10*6/uL (ref 4.40–5.90)
RDW: 18.5 % — AB (ref 11.5–14.5)
WBC: 22 10*3/uL — ABNORMAL HIGH (ref 3.8–10.6)

## 2015-03-28 LAB — TROPONIN I: TROPONIN-I: 0.05 ng/mL — AB

## 2015-03-28 LAB — PRO B NATRIURETIC PEPTIDE: B-Type Natriuretic Peptide: 212 pg/mL — ABNORMAL HIGH

## 2015-04-05 NOTE — Op Note (Signed)
PATIENT NAME:  Isaac Elliott, Ruston MR#:  [id number] DATE OF BIRTH:  02/17/1951  DATE OF PROCEDURE:  03/13/2013  PREOPERATIVE DIAGNOSES: 1.  Lung cancer.  2.  Poor venous access.  3.  History of left upper extremity deep vein thrombosis.  4.  Obesity.  5.  Hypertension.   POSTOPERATIVE DIAGNOSES: 1.  Lung cancer.  2.  Poor venous access.  3.  History of left upper extremity deep vein thrombosis.  4.  Obesity.  5.  Hypertension.  6.  Extensive central venous thrombosis.  PROCEDURES PERFORMED:  1.  Ultrasound guidance for vascular access, right jugular vein.  2.  Right jugular venogram and central venogram through right jugular sheath.   SURGEON: Annice NeedyJason S. Drake Wuertz, M.D.   ANESTHESIA: Local with moderate conscious sedation.   ESTIMATED BLOOD LOSS: Minimal.   FLUOROSCOPY TIME: Less than 1 minute.  CONTRAST USED:  5 mL.   INDICATION FOR PROCEDURE: This is a 64 year old African American male who needs a port for chemotherapy access. He does have a history of left upper extremity DVT and his venous access in the peripheral sense is poor. We were initially asked to place a Port-A-Cath. He is brought in for evaluation.   DESCRIPTION OF PROCEDURE: The patient is brought to the vascular and interventional radiology suite. Due to his previous history of left upper extremity DVT, I planned A venogram to evaluate the central veins before placing a port. Jugular vein was accessed under direct ultrasound guidance without difficult with a micropuncture needle. Micropuncture wire and sheath were then placed. The micropuncture wire seemed to coil and not pass the innominate superior vena cava area. With the micropuncture sheath in place, a venogram was performed of the jugular vein and central venous circulation. This demonstrated extensive thrombosis involving the left innominate, right jugular at the innominate confluence, and entire superior vena cava with extensive thrombosis. There were some collateralization  present and it was difficult to discern the duration of this thrombosis. I contacted Dr. Orlie DakinFinnegan and discussed options and he and I both recommended stopping the procedure, not placing a port and anticoagulating the patient for the central venous thrombosis. The sheath was removed, pressure was held and a sterile dressing was placed. The patient tolerated the procedure well.  ____________________________ Annice NeedyJason S. Solenne Manwarren, MD jsd:sb D: 03/13/2013 12:54:32 ET T: 03/13/2013 13:11:09 ET JOB#: [id number]  cc: Annice NeedyJason S. Christpher Stogsdill, MD, <Dictator> Annice NeedyJASON S Kaia Depaolis MD ELECTRONICALLY SIGNED 03/15/2013 10:21

## 2015-04-05 NOTE — Op Note (Signed)
PATIENT NAME:  Isaac Elliott, Isaac Elliott MR#:  [id number] DATE OF BIRTH:  Mar 16, 1951  DATE OF PROCEDURE:  04/26/2013  PREOPERATIVE DIAGNOSES: 1.  Lung cancer.  2.  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 3.  Hypertension.   POSTOPERATIVE DIAGNOSES: 1.  Lung cancer.  2.  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 3.  Hypertension.   PROCEDURE PERFORMED:   1.  Ultrasound guidance for vascular access, right jugular vein.  2.  Right jugular venogram and superior venacavogram.  3.  Fluoroscopic guidance for placement of catheter.  4.  Placement of a CT compatible Infuse-a-Port, right internal jugular vein.   SURGEON: Annice NeedyJason S Dew, M.D.   ANESTHESIA: Local with moderate conscious sedation.   ESTIMATED BLOOD LOSS: Approximately 50 mL.  FLUOROSCOPY TIME:  One minute  CONTRAST USED:  5 mL.   INDICATION FOR PROCEDURE: This is a gentleman with significant malignancy. He has a lung mass, he has a history of colon cancer. He has poor venous access.  A previous planned Port-A-Cath had to be abandoned due to superior vena cava thrombosis. He has been treated with anticoagulation and is brought back for a venogram to see if a Port-A-Cath can be placed. Risks and benefits were discussed. Informed consent was obtained   DESCRIPTION OF PROCEDURE: The patient was brought to the vascular interventional radiology suite. The right neck and chest were sterilely prepped and draped and a sterile surgical field was created.  We started by accessing the right jugular vein with a micropuncture needle under direct ultrasound guidance.  A micropuncture wire and sheath were then placed. Imaging was performed through the micropuncture sheath. This demonstrated a patent jugular vein.  The superior vena cava was largely patent with mild chronic thrombus present in the mid to distal superior vena cava. There was a large flow lumen remaining and plenty of room to place a port.   At this point, we  exchanged for an 0.35 wire and place the peel-away sheath.  A counter incision was made below the right clavicle and an inferior pocket was created with electrocautery and blunt dissection. The port was secured to the chest wall on the subclavicular incision with 2 Prolene sutures connected the catheter. The catheter was tunneled from the subclavicular incision to the access site. Fluoroscopic guidance was used to cut the catheter to an appropriate length. It was then placed through the peel-away sheath and the peel-away sheath was removed. The catheter tip was parked just into the right atrium. It withdrew blood well and flushed easily with heparinized saline. The wound was then irrigated with antibiotic-impregnated saline and closed with a running 3-0 Vicryl and 4-0 Monocryl. The access incision was closed with a single 4-0 Monocryl. Dermabond was placed as dressing. The patient tolerated the procedure well and was taken to the recovery room in stable condition.    ____________________________ Annice NeedyJason S. Dew, MD jsd:ct D: 04/26/2013 10:16:35 ET T: 04/26/2013 11:29:53 ET JOB#: [id number]  cc: Annice NeedyJason S. Dew, MD, <Dictator> Tollie Pizzaimothy J. Orlie DakinFinnegan, MD Annice NeedyJASON S DEW MD ELECTRONICALLY SIGNED 05/01/2013 13:54

## 2015-04-05 NOTE — Op Note (Signed)
PATIENT NAME:  Isaac Elliott, [name] MR#:  [id number] DATE OF BIRTH:  [birth year]  DATE OF PROCEDURE:  07/04/2013  PREOPERATIVE DIAGNOSES 1.  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   2.  Poorly functioning Port-A-Cath.   POSTOPERATIVE DIAGNOSES:   1.  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   2.  Poorly functioning Port-A-Cath.   PROCEDURE:  Catheter injection through Port-A-Cath.   SURGEON: Annice NeedyJason S. Dew, M.D.   ANESTHESIA: None.   ESTIMATED BLOOD LOSS: None.   INDICATION FOR PROCEDURE: A 64 year old gentleman with advanced colon cancer. His port was not working well and we are asked to check this.   DESCRIPTION OF PROCEDURE: The patient's existing port and the area surrounding the port were sterilely prepped and draped. The port was accessed without difficulty with the [facility]. It did withdraw some blood and flushed well with heparinized saline, and a contrast injection showed the catheter to be in good location with a patent vein.  The needle was then removed.     ____________________________ Annice NeedyJason S. Dew, MD jsd:cs D: 07/05/2013 16:46:20 ET T: 07/05/2013 19:55:04 ET JOB#: [id number]  cc: Annice NeedyJason S. Dew, MD, <Dictator> Annice NeedyJASON S DEW MD ELECTRONICALLY SIGNED 07/10/2013 12:29

## 2015-04-06 NOTE — Consult Note (Signed)
General Aspect 64 y/o male w/o prior cardiac hx whom we've been asked to eval 2/2 syncope. ***************************   Present Illness 64 y/o male w/o prior cardiac hx.  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  He lives in [place] with his niece and says that he is able to be relatively active w/o chest pain or dyspnea.  He was in his USOH until this AM, when @ approximately 3AM, prior to going to bed, he thought that he heard a car coming up his driveway and he got up and opened his front door.  Without warning, he apparently lost consciousness and fell to the floor.  He does not remember falling and denies pre-syncope, c/p, dyspnea, n/v prior to losing consciousness.  His niece heard him fall and called EMS.  His next memory is that of being helped up by EMS staff.  He was groggy when he regained consciousness but again was w/o c/p, dyspnea, n/v, or pre-syncope.  Once standing, he apparently lost consciousness again.  At that point he was taken to the [facility] ED.  Here, he was hemodynamicallys stable.  He was found to have a UTI and creat was mildly elevated.  Trops have been nl.  K low (chronic).  ECG showed LVH with lateral T flattenting and ant TWI.  Currently he is groggy but arrousable.  No events on tele.  Echo performed, read pending.   Physical Exam:  GEN groggy.  pleasant, nad.   HEENT pink conjunctivae, moist oral mucosa, poor dentition   NECK supple  No masses  no bruits/jvd.   RESP normal resp effort  clear BS   CARD Regular rate and rhythm  Normal, S1, S2   ABD denies tenderness  distended  normal BS   LYMPH negative neck   EXTR negative cyanosis/clubbing, trace L>R LEE.   SKIN normal to palpation   NEURO grossly intact, nonfocal.   PSYCH alert, A+O to time, place, person, groggy.   Review of  Systems:  General: groggy.  wt loss over the past year.   Skin: No Complaints   ENT: No Complaints   Eyes: No Complaints   Neck: No Complaints   Respiratory: No Complaints   Cardiovascular: syncope.  intermittent LEE.   Gastrointestinal: No Complaints   Genitourinary: No Complaints   Vascular: No Complaints   Musculoskeletal: No Complaints   Neurologic: No Complaints   Hematologic: No Complaints   Endocrine: No Complaints   Psychiatric: No Complaints   Review of Systems: All other systems were reviewed and found to be negative   Medications/Allergies Reviewed Medications/Allergies reviewed   Family & Social History:  Family and Social History:  Family History Father died in his 58's after several CVA's. Mother is alive in her 1's with HTN/DM.  Siblings A & W.  No premature CAD.   Social History Outpt notes indicate that he was a smoker until recently, though he says that he smoked briefly in his 45's and none since.  Occas alcoholic drink. No drugs.   Place of Living Home  Lives in [place].  His niece lives with him.     Hypokalemia:    LUE DVT & SVC Thombus: a. Found 02/2013;  b. Chronic coumadin.   Hypertension:    Brain Met:    Lung Met:  Colon Cancer:   Home Medications: Medication Instructions Status  Klor-Con M20 20 mEq oral tablet, extended release 2 tab(s) orally 2 times a day Active  amLODIPine 10 mg oral tablet 1 tab(s) orally once a day Active  pantoprazole 40 mg oral delayed release tablet 1 tab(s) orally once a day Active  Coumadin 5 mg oral tablet 2 tab(s) orally once a day Active  docusate sodium sodium 100 mg oral capsule 1 cap(s) orally 2 times a day Active  magnesium oxide 500 mg oral tablet 1 tab(s) orally once a day Active  furosemide 20 mg oral tablet 1 tab(s) orally 2 times a day Active   Lab Results:  LabObservation:  05-Jun-15 10:35   OBSERVATION Reason for Test  Hepatic:  05-Jun-15 04:25   Bilirubin, Total  1.4   Alkaline Phosphatase 90 (45-117 NOTE: New Reference Range 11/03/13)  SGPT (ALT) 28  SGOT (AST)  38  Total Protein, Serum 7.4  Albumin, Serum 3.7  Routine Chem:  05-Jun-15 04:25   Glucose, Serum 81  BUN  26  Creatinine (comp)  1.47  Sodium, Serum 144  Potassium, Serum  2.8  Chloride, Serum  110  CO2, Serum 22  Calcium (Total), Serum 9.6  Osmolality (calc) 291  eGFR (African American)  58  eGFR (Non-African American)  50 (eGFR values <30mL/min/1.73 m2 may be an indication of chronic kidney disease (CKD). Calculated eGFR is useful in patients with stable renal function. The eGFR calculation will not be reliable in acutely ill patients when serum creatinine is changing rapidly. It is not useful in  patients on dialysis. The eGFR calculation may not be applicable to patients at the low and high extremes of body sizes, pregnant women, and vegetarians.)  Anion Gap 12  Lipase  53 (Result(s) reported on 18 May 2014 at 05:09AM.)  Magnesium, Serum 2.0 (1.8-2.4 THERAPEUTIC RANGE: 4-7 mg/dL TOXIC: > 10 mg/dL  -----------------------)  Ethanol, S. < 3 (Result(s) reported on 18 May 2014 at 05:16AM.)  Cardiac:  05-Jun-15 04:25   Troponin I < 0.02 (0.00-0.05 0.05 ng/mL or less: NEGATIVE  Repeat testing in 3-6 hrs  if clinically indicated. >0.05 ng/mL: POTENTIAL  MYOCARDIAL INJURY. Repeat  testing in 3-6 hrs if  clinically indicated. NOTE: An increase or decrease  of 30% or more on serial  testing suggests a  clinically important change)    09:30   CK, Total 39 (39-308 NOTE: NEW REFERENCE RANGE  01/15/2014)  CPK-MB, Serum 0.9 (Result(s) reported on 18 May 2014 at 09:58AM.)  Troponin I < 0.02 (0.00-0.05 0.05 ng/mL or less: NEGATIVE  Repeat testing in 3-6 hrs  if clinically indicated. >0.05 ng/mL: POTENTIAL  MYOCARDIAL INJURY. Repeat  testing in 3-6 hrs if  clinically indicated. NOTE: An increase or decrease  of 30% or more on serial  testing suggests a  clinically  important change)  Routine UA:  05-Jun-15 04:59   Color (UA) Amber  Clarity (UA) Cloudy  Glucose (UA) Negative  Bilirubin (UA) Negative  Ketones (UA) Negative  Specific Gravity (UA) 1.013  Blood (UA) 3+  pH (UA) 6.0  Protein (UA) 100 mg/dL  Nitrite (UA) Negative  Leukocyte Esterase (UA) 3+ (Result(s) reported on 18 May 2014 at 05:53AM.)  RBC (UA) 220 /HPF  WBC (UA) 562 /HPF  Bacteria (UA) 3+  Epithelial Cells (UA) 2 /HPF  Mucous (UA) PRESENT  Hyaline Cast (UA) 20 /LPF (Result(s) reported on 18 May 2014 at 05:53AM.)  Routine Coag:  05-Jun-15 04:25   Prothrombin  15.0  INR 1.2 (INR reference interval applies to patients on anticoagulant therapy. A single INR therapeutic range for coumarins is not optimal for all indications; however, the suggested range for most indications is 2.0 - 3.0. Exceptions to the INR Reference Range may include: Prosthetic heart valves, acute myocardial infarction, prevention of myocardial infarction, and combinations of aspirin and anticoagulant. The need for a higher or lower target INR must be assessed individually. Reference: The Pharmacology and Management of the Vitamin K  antagonists: the seventh ACCP Conference on Antithrombotic and Thrombolytic Therapy. Chest.2004 Sept:126 (3suppl): L7870634. A HCT value >55% may artifactually increase the PT.  In one study,  the increase was an average of 25%. Reference:  "Effect on Routine and Special Coagulation Testing Values of Citrate Anticoagulant Adjustment in Patients with High HCT Values." American Journal of Clinical Pathology 2006;126:400-405.)  Routine Hem:  05-Jun-15 04:25   WBC (CBC) 5.7  RBC (CBC)  3.89  Hemoglobin (CBC)  11.3  Hematocrit (CBC)  35.4  Platelet Count (CBC)  125 (Result(s) reported on 18 May 2014 at 05:12AM.)  MCV 91  MCH 29.1  MCHC  31.9  RDW  24.1   EKG:  EKG Interp. by me   Interpretation RSR, 91, left axis, LVH, lateral T flattening, ant TWI - old ECG not avail  for review though report reads as NSR w/o abnormalities.   Radiology Results: XRay:    05-Jun-15 04:39, Chest Portable Single View  Chest Portable Single View   REASON FOR EXAM:    syncope, hx of cancer  COMMENTS:       PROCEDURE: DXR - DXR PORTABLE CHEST SINGLE VIEW  - May 18 2014  4:39AM     CLINICAL DATA:  Syncope.  History of colon cancer.    EXAM:  PORTABLE CHEST - 1 VIEW    COMPARISON:  Body CT 03/14/2014.    FINDINGS:  There is no cardiomegaly. Widening of the mid and upper mediastinum  accentuated by distortion from rightward rotation. The aorta is also  tortuous a baseline and there is a treated right hilar and  mediastinal mass. No aortic aneurysm on preceding CT imaging    There is no consolidation, edema, effusion, or pneumothorax. Gas  distended bowel underneath the left diaphragm, mildly elevating the  diaphragm.     IMPRESSION:  1. No evidence of acute cardiopulmonary disease.  2. Gas distended stomach.      Electronically Signed    By: Tiburcio Pea M.D.    On: 05/18/2014 04:58     Verified By: Audry Riles, M.D.,    No Known Allergies:   Vital Signs/Nurse's Notes: **Vital Signs.:   05-Jun-15 09:00  Vital Signs Type Admission  Temperature Temperature (F) 97.7  Celsius 36.5  Temperature Source oral  Pulse Pulse 66  Respirations Respirations 20  Systolic BP Systolic BP 144  Diastolic BP (mmHg) Diastolic BP (mmHg) 90  Mean BP 108  Pulse Ox % Pulse Ox % 100  *Intake and Output.:   05-Jun-15 11:21  Weight Method Bed    Shift 15:00  Grand Totals Intake:  465 Output:  200    Net:  265 24 Hr.:  265  Oral Intake      In:  240  IV (Primary)      In:  100  IV (Secondary)      In:  125  Urine ml     Out:  200  Length of Stay Totals Intake:  465 Output:  200  Net:  265    Impression 1.  Syncope:  Pt presented following a syncopal episode which occurred suddenly this morning while standing w/o prodromal Ss.  Based on his report, he  did not regain consciousness until EMS arrived, thus presumably he was w/o consciousness for several minutes.  He then had another episode when he was assisted to stand.  He is currently mildly hypertensive.  He was not orthostatic in the ED.  He was found to have a UTI and creat is mildly elevated suggesting some degree of dehydration.  INR subRx @ 1.2 with h/o LUE DVT and SVC thrombus dating back to 02/2013.  He is not dyspneic, hypoxic, or tachycardic, and has not had c/p-->thus PE unlikely as cause of syncope.  Tele shows sinus rhythm w/o evidence of brady/tachyarrhythmias up to this point.  ECG shows LVH with ST/T changes of unclear significance @ this point.  Will follow on tele.  Echo has been performed and read is pending.  More recs following echo.  2.  Abnl ECG:  As above, ECG with LVH and lateral T flattening and ant TWI.  No old for comparison at this time (cannot pull up on computer though it was read out as being normal).  He denies c/p or dyspnea.  Troponins normal.  As above, follow tele and await echo.  Could consider outpt stress testing for risk stratification.  3.  SVC thrombus:  SubRx INR.  Agree w/ lovenox bridging.  4.  Metastatic Stage IV colon CA with mets to lung/brain:  Followed closely @ cancer center.  5.  Hypokalemia:  Reports noncompliance with home regimen of KCl.  Supp as ordered.  6.  UTI:  Abx per IM.  7.  Noncompliance:  Finances seem to be the biggest issue.  8.  HTN:  On amlodipine.  Follow.   Electronic Signatures for Addendum Section:  Kathlyn Sacramento (MD) (Signed Addendum 05-Jun-15 18:38)  The patient was seen and examined. Agree with the above. He has known history of stage 4 colon cancer who presented after a syncopal episode. he does not recall the event very well. He was found to have ARF (likely due to volume depletion) and UTI. He denies any chest pain. ECG is abnormal with anterolateral TW changes but there is LVH. Cardiac enzymes are negative. Echo  showed normal LVSF.  I suspect that syncope is not cardiac  but more likely to be due to volume depletion and UTI. No further cardiac work up is recommended at this time.   Electronic Signatures: Kathlyn Sacramento (MD)  (Signed 05-Jun-15 18:38)  Co-Signer: General Aspect/Present Illness, History and Physical Exam, Review of System, Family & Social History, Past Medical History, Home Medications, Labs, EKG , Radiology, Allergies, Vital Signs/Nurse's Notes, Impression/Plan Rogelia Mire (NP)  (Signed 05-Jun-15 12:32)  Authored: General Aspect/Present Illness, History and Physical Exam, Review of System, Family & Social History, Past Medical History, Home Medications, Labs, EKG , Radiology, Allergies, Vital Signs/Nurse's Notes, Impression/Plan   Last Updated: 05-Jun-15 18:38 by Kathlyn Sacramento (MD)

## 2015-04-06 NOTE — Discharge Summary (Signed)
PATIENT NAME:  Isaac Elliott, Isaac Elliott MR#:  [id number] DATE OF BIRTH:  [birth year]  DATE OF ADMISSION:  05/18/2014 DATE OF DISCHARGE:  05/20/2014  DISCHARGE DIAGNOSES: 1.  Syncope.  2.  Escherichia coli urinary tract infection.  3.  Hypokalemia.  4.  Dehydration.  5.  Chemotherapy-induced diarrhea.  6.  Cancer on chemotherapy.  7.  Deconditioning.   CONSULTATIONS:  Dr. Mariah MillingGollan with cardiology.   IMAGING STUDIES DONE: Include a chest x-ray which showed no evidence of cardiopulmonary disease.   Echocardiogram showed left ventricular ejection fraction of 55% to 60% with some hypertrophy. Nothing acute. No focal wall motion abnormalities.   ADMITTING HISTORY AND PHYSICAL AND HOSPITAL COURSE: Please see detailed H and P dictated by Dr. Juliene PinaMody.  In brief, a 64 year old male patient with history of brain cancer/lung cancer/colon cancer, actively under chemotherapy, presented to the hospital complaining of a syncopal episode. The patient was found to be dehydrated with UTI and admitted to the hospitalist service.   HOSPITAL COURSE: 1.  Syncope. This was thought to be secondary to dehydration and UTI. The patient had E. coli growing in his urine sensitive to ciprofloxacin, which he will be discharged on for 4 days. The patient was also significantly hypokalemic contributing to his weakness and syncope. The patient was ambulated on day of discharge. He felt back to baseline. He was rehydrated with IV fluids. Potassium replaced aggressively to a normal level. The patient is being discharged home to follow up with his primary care physician.   Prior to discharge, the patient's motor strength is 5/5 in upper and lower extremities. Gait is normal. S1 and S2 without any murmurs.  Lungs are clear.   DISCHARGE MEDICATIONS: 1.  Potassium 20 mg oral 2 times a day.  2.  Norvasc 10 mg daily.  3.  Ciprofloxacin 250 mg oral 2 times a day.  4.  Coumadin 5 mg 2 tablets once a day.  5.  Docusate sodium 100 mg oral 2 times a  day.  6.  Lasix 20 mg oral 2 times a day but held until the patient sees his doctor.  7.  Magnesium oxide 500 mg oral once a day.  8.  Protonix 40 mg oral once a day.   DISCHARGE INSTRUCTIONS: Low-salt diet. Activity as tolerated. Follow up with primary care physician in 1 to 2 weeks. Hold Lasix until you see a doctor.     Time spent today on this discharge summary was 40 minutes.    ____________________________ Isaac BailiffSrikar R. Marissah Vandemark, MD srs:dmm Elliott: 05/22/2014 15:25:29 ET T: 05/22/2014 19:11:06 ET JOB#: [id number]  cc: Wardell HeathSrikar R. Isaac Shadden, MD, <Dictator> Tollie Pizzaimothy J. Isaac DakinFinnegan, MD Isaac FishermanSRIKAR R Isaac Pichon MD ELECTRONICALLY SIGNED 05/24/2014 16:39

## 2015-04-06 NOTE — H&P (Signed)
PATIENT NAME:  Isaac Elliott, Isaac Elliott MR#:  [id number] DATE OF BIRTH:  08/07/1951  DATE OF ADMISSION:  05/18/2014  PRIMARY CARE PHYSICIAN: Dr. Grayland Ormond  CHIEF COMPLAINT: Syncope.   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 The patient says about 3:00 this morning he heard someone driving up towards his house so he went to check. He stepped on the porch then all of a sudden passed out. He has had some loose stools since chemotherapy, but no other fever, chills, dizziness or lightheadedness.  REVIEW OF SYSTEMS: CONSTITUTIONAL: No fever, fatigue. Positive weakness. No weight loss or gain. EYES: No blurred or double vision, glaucoma or cataracts.  ENT: No hearing loss, postnasal drip or snoring. RESPIRATORY: No cough, wheezing, hemoptysis, or COPD. CARDIOVASCULAR: No chest pain, orthopnea, edema, arrhythmia, dyspnea on exertion, palpitations. Positive syncope.  GASTROINTESTINAL: No nausea or vomiting. Positive diarrhea/loose stools. No abdominal pain, melena, or ulcers. GENITOURINARY: No dysuria or hematuria.  ENDOCRINE: No polyuria or polydipsia.  HEMATOLOGIC AND LYMPHATIC: Positive anemia and easy bruising. No bleeding or swollen glands.  SKIN: No rash or lesions. MUSCULOSKELETAL: No limited activity. NEUROLOGIC: No history of CVA, TIA. PSYCHIATRIC: No history of anxiety or depression.   PAST MEDICAL HISTORY: 1.  Brain cancer, lung cancer, colon cancer.  2.  Hypertension.  3.  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   MEDICATIONS: 1.  Pantoprazole 40 mg daily.  2.  Magnesium oxide 500 mg daily.  3.  K-Chlor 20 mEq b.i.d.  4.  Lasix 40 mg b.i.d.  5.  Docusate 1 tablet daily.  6.  Coumadin 5 mg 2 tablets daily.  7.  Norvasc 10 mg daily.   ALLERGIES: No known drug allergies.   PAST SURGICAL HISTORY:  1.  Partial colectomy.   2.  Appendectomy.   SOCIAL HISTORY: No tobacco, alcohol or IV drug use.   FAMILY HISTORY: Positive for borderline diabetes and hypertension.   PHYSICAL EXAMINATION: VITAL SIGNS: Temperature 97.7, pulse 75, respirations 20, blood pressure 134/70, 96% on room air.  GENERAL: The patient is alert and oriented, not in acute distress. HEENT: Head is atraumatic, normocephalic. Pupils are round and reactive. Sclerae anicteric. Mucous membranes are moist. Oropharynx is clear.  NECK: Supple without JVD, carotid bruit or enlarged thyroid. CARDIOVASCULAR: Regular rate and rhythm. No murmurs, gallops, or rubs. PMI is not displaced.  ABDOMEN: Bowel sounds are present. Nontender, nondistended. No hepatosplenomegaly.  EXTREMITIES: No clubbing, cyanosis or edema.  NEUROLOGIC: Cranial nerves II through XII are intact. No focal deficits.  SKIN: No rashes or lesions.  BACK: No CVA or vertebral tenderness.   DIAGNOSTIC DATA: Urinalysis: 3+ LCE, 562 white blood cells, 220 red blood cells.   Sodium 144, potassium 2.8, chloride 110, bicarb 22, BUN 26, creatinine 1.47, glucose 81, calcium 9.6, bilirubin 1.4, alk phos 90, ALT 28, AST 38, total protein 7.4, albumin 3.7. White blood cells 5.7, hemoglobin 11.3, hematocrit 36, platelets 125,000. Lipase 53. Magnesium 2. Troponin less than 0.2. INR is 1.2.   EKG shows sinus rhythm with some inversion in T waves, in the anterolateral leads.   Chest x-ray shows no acute cardiopulmonary disease.   ASSESSMENT AND PLAN: A 64 year old male who presents with syncope. 1.  Syncope. The patient will be admitted to telemetry to rule out cardiac etiology as he does have new EKG changes of T wave inversions. I  will continue to cycle his cardiac enzymes. It is also noted he had a urinary tract infection which could contribute to his syncopal episode. He was not orthostatic in the ER, but will continue to monitor. It is also noted that he is hypokalemic, which may as well contribute.  I will go ahead and also consult cardiology regarding his abnormal EKG and syncope and also order an echocardiogram.  2.  Hypokalemia. Will replete. The patient does have a history of hypokalemia, on potassium supplements. Will also check magnesium level.  3.  Superior vena cava clot. The patient's INR is subtherapeutic. For now we will bridge with Lovenox and will monitor his platelet count as it is 125,000 today.  4.  History of cancer, actively receiving chemotherapy. He follows up with Dr. Grayland Ormond. 5.  Hypertension. We will continue Norvasc.  6.  Acute kidney injury, likely secondary to dehydration. We will provide some IV fluids and repeat a BMP in the a.m.   The patient is FULL code status.    TIME SPENT: Approximately 45 minutes.  ____________________________ Isaac Elliott. Benjie Karvonen, MD spm:sb D: 05/18/2014 08:00:05 ET T: 05/18/2014 08:41:27 ET JOB#: [id number]  cc: Sergi Gellner P. Benjie Karvonen, MD, <Dictator> Kathlene November. Grayland Ormond, MD Isaac Elliott [place] Searles MD ELECTRONICALLY SIGNED 05/18/2014 12:35

## 2015-04-14 NOTE — Consult Note (Signed)
Discussed with Primary doctor, Dr. 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  Therefore will not do any barium enema or colonoscopy. may be discharged when OK with hospitalist.  Electronic Signatures: Scot JunElliott, Cadence Minton T (MD)  (Signed on 24-Feb-16 10:46)  Authored  Last Updated: 24-Feb-16 10:46 by Scot JunElliott, Katianna Mcclenney T (MD)

## 2015-04-14 NOTE — H&P (Signed)
PATIENT NAME:  Isaac Elliott, STROEBEL MR#:  [id number] DATE OF BIRTH:  Jul 03, 1951  DATE OF ADMISSION:  02/04/2015  PRIMARY CARE PHYSICIAN:  Dr. Gerarda Fraction, oncology.   REFERRING EMERGENCY ROOM PHYSICIAN: Dr. Fanny Bien.   CHIEF COMPLAINT: Chest pain.   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 He reports that he woke up in his usual state of walk and walked to get the newspaper and walked back in.  When he sat down he developed a 10 out of 10 chest pain in a band across his chest which lasted for about 20 minutes. He laid down to see if it would improve, symptoms did not improve, so he called EMS. He was brought to the Emergency Room for further evaluation. He denies nausea, vomiting, or diaphoresis. He did feel short of breath while he was having the chest pain. At the time of presentation to the Emergency Room his vitals are stable. His initial troponin mildly elevated at 0.08. EKG shows nonspecific ST changes. He is being admitted for cardiac rule out. In the Emergency Room his potassium was also noted to be 2.1.    PAST MEDICAL HISTORY:  1. 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  2. Hypertension.  3. Status post partial bowel resection   FAMILY MEDICAL HISTORY: Positive for stroke, diabetes, hypertension. His grandmother died in her 79s of colon cancer.   SOCIAL HISTORY: The patient lives with his mother. He does not smoke cigarettes. Rare alcohol use. No illicit substance abuse. No cane or walker needed. Note that he quit smoking several months ago and smoked about 2 packs per week for many years.   ALLERGIES: No known allergies.   HOME MEDICATIONS:  1. Magnesium oxide 500 mg 1 tablet once a day.  2. Klor-Con 20 mEq 2 tablets twice a day.  3. Amlodipine 10 mg 1 tablet once a day.   4. Note that the patient is also receiving  chemotherapy from Dr. Orlie Dakin, states that he receives treatments once a month.  5. Lasix as needed for edema.   REVIEW OF SYSTEMS:  CONSTITUTIONAL: Negative for fevers or chills, weakness, pain, or change in weight.  HEENT: No change in vision or hearing. No pain in the eyes or ears. No sore throat, sinus congestion, or difficulty swallowing.  RESPIRATORY: No coughing or wheezing. No hemoptysis. Does report shortness of breath during this episode of chest pain today. No history of COPD or tuberculosis.  CARDIOVASCULAR: Chest pain as described above. No orthopnea, edema, or palpitations. No syncope.  GASTROINTESTINAL: No nausea, vomiting, diarrhea, abdominal pain, or change in bowel habit.  GENITOURINARY: No dysuria or frequency.  ENDOCRINE: No polyuria, polydipsia, hot or cold intolerance.  SKIN: No new rashes or lesions. He does have a chronic rash which he attributes to his chemotherapy.  MUSCULOSKELETAL: No new pain in the neck, back, shoulders, knees, or hips. No arthritis or gout.  NEUROLOGIC: No focal numbness or weakness. No CVA, seizure, or dementia.  PSYCHIATRIC: No bipolar disorder or schizophrenia.   PHYSICAL EXAMINATION:  VITAL SIGNS: Temperature 98.4, pulse 66, respirations 20, blood pressure 133/84, oxygenation 100% on room air.  HEENT: Pupils equal, round, and reactive to light. Conjunctivae clear. Extraocular motion intact. Oral mucous membranes are dry. Posterior oropharynx is clear. No exudate, edema, or erythema. Poor dentition with gingivitis. Trachea midline.  NECK: Supple. No  cervical lymphadenopathy. Thyroid nontender.  RESPIRATORY: There are bibasilar fine crackles, good air movement. No respiratory distress.  CARDIOVASCULAR: Regular rate and rhythm. No murmurs, rubs, or gallops. No peripheral edema. Peripheral pulses 2 +.  ABDOMEN: Grossly distended, tympanic with decreased bowel sounds. No tenderness to palpation. No hepatosplenomegaly noted. No guarding, no rebound.   MUSCULOSKELETAL: No joint effusions. Range of motion normal. Strength 5 out of 5 throughout. SKIN: Skin turgor is poor.  Skin is dry.  NEUROLOGIC: Cranial nerves II through XII grossly intact. Strength and sensation intact. Nonfocal.  PSYCHIATRIC: The patient alert and oriented x 4. He seems depressed.   LABORATORY DATA: Sodium 140, potassium 2.1, chloride 104, bicarbonate 30, BUN 13, creatinine 0.84, glucose 102. LFTs from February 16 show albumin of 2.8, alkaline phosphatase 148, AST 43. Troponin today 0.08. White blood cells 7.8, hemoglobin 11.9, platelets 171,000, MCV 101. D-dimer is 620.   IMAGING: CT angiography of the chest for PE is negative for pulmonary embolus. There is widespread adenopathy as well as pulmonary metastases. There is increasing pneumonitis around the mass in the superior segment of the right lower lobe compared to recent prior study. There is a sclerotic bone mass at T10 on the right noted. There is a marked large bowel distention incompletely visualized, but grossly stable compared to 3 days ago.   KUB showing severe gaseous distention of the colon, stable from February 19.    ASSESSMENT AND PLAN:  1.  Chest pain.  This is the patient's presenting complaint. His first troponin is 0.08. His EKG does not show ST elevation. His last 2-D echocardiogram was about 6 months ago, shows a preserved ejection fraction with diastolic dysfunction and left ventricular hypertrophy. We will continue to cycle cardiac enzymes. We will monitor on telemetry as a cardiac rule out. However his CTA shows increasing pneumonitis around the mass in the right lower lobe and I suspect this is likely causing some chest pain as well. We will consult oncology to assist with management and possible steroids versus chemotherapy. We will start empiric antibiotics though I do not think this is an infectious pneumonitis.  2.  Severe hypokalemia: This patient has chronic hypokalemia, but today with presents  with a potassium of 2.1. Replace IV and recheck this evening. Check magnesium. Monitor on telemetry.  3.  Severe colonic distention: This seems to be stable on several prior films, but it is fairly worrisome. We will correct his electrolytes. He does have colon cancer, but does not seem to have a bowel obstruction, he is still passing stool. He may have a partial obstruction. I will consult gastrenterology to see if they can be of assistance in decompressing this very large bowel. No signs of perforation. NPO for now. 4.  Metastatic colon cancer with metastases to the brain and lungs. We will consult oncology to continue with management.  He states that he had an appointment with oncology for tomorrow. We will also consult palliative care for goals of care discussion, the patient refused this conversation with me today. 5.  Hypertension. Blood pressure is controlled at this time. We will continue his home regimen. 6.  Prophylaxis: Heparin for DVT prophylaxis. No GI prophylaxis at this time.   TIME SPENT ON ADMISSION: 45 minutes.     ____________________________ Ena Dawleyatherine P. Clent RidgesWalsh, MD cpw:bu D: 02/04/2015 15:51:42 ET T: 02/04/2015 16:25:58 ET JOB#: [id number]  cc: Santina Evansatherine P. Clent RidgesWalsh, MD, <Dictator> Gale JourneyATHERINE P WALSH MD ELECTRONICALLY SIGNED 02/05/2015 8:20

## 2015-04-14 NOTE — Consult Note (Signed)
PATIENT NAME:  Isaac Elliott, Isaac Elliott MR#:  [id number] DATE OF BIRTH:  1951-11-26  DATE OF CONSULTATION:  02/05/2015  CONSULTING PHYSICIAN:  Lamar BlinksBruce J. Jibril Mcminn, MD  REQUESTING PHYSICIAN:  Katharina Caperima Vaickute, MD   REASON FOR CONSULTATION: Chest discomfort with elevated troponin.   CHIEF COMPLAINT: "I have chest pain."   HISTORY OF PRESENT ILLNESS:  This is a 64 year old male with significant colon cancer having essential hypertension as a risk factor, otherwise, no other cardiovascular risk factors who had acute onset of chest discomfort, substernal, radiating into the back with high grade concerns. The patient came to the Emergency Room with an elevated troponin of 0.08 and had an EKG showing normal sinus rhythm with left ventricular hypertrophy and left atrial enlargement but no evidence of EKG changes consistent with true ischemia and/or infarction. The patient since then has had telemetry showing normal sinus rhythm and no further evidence of chest discomfort. He was placed on appropriate aspirin and has been ambulating throughout the hallways without recurrence of chest discomfort and more consistent with issues with musculoskeletal abnormalities and lung issues but no evidence of current ongoing anginal symptoms. The patient feels much better now and may be able to further ambulate if able. Remainder review of systems negative for vision change, ringing in the ears, hearing loss, cough, heartburn, nausea, vomiting, diarrhea, bloody stools, stomach pain, extremity pain, leg weakness, cramping of the buttocks. No blood clots, headaches, blackouts, dizzy spells, nosebleeds, congestion, weakness, fatigue, frequent urination, urination at night.   PAST MEDICAL HISTORY:  1.  Colon cancer.  2.  Essential hypertension.   FAMILY HISTORY: Multiple family history of cancer and/or hypertension but no other cardiovascular disease.   SOCIAL HISTORY: Currently denies alcohol or tobacco use.   ALLERGIES: As listed.    MEDICATIONS: As listed.   PHYSICAL EXAMINATION:  VITAL SIGNS: Blood pressure is 108/62 bilaterally. Heart rate is 70. GENERAL:  Upright, reclining, and regular well appearing male in no acute distress.  HEENT: No icterus, thyromegaly, ulcers, hemorrhage, or xanthelasma.  CARDIOVASCULAR: Regular rate and rhythm. Normal S1 and S2 without murmur, gallop, or rub. PMI is inferiorly displaced. Carotid upstroke normal without bruit. Jugular venous pressure is normal.  LUNGS: Have bibasilar crackles with decreased breath sounds.  ABDOMEN: Soft with some mild diffuse tenderness and no apparent hepatosplenomegaly or masses.  EXTREMITIES: 2+ radial, femoral, trace dorsal pedal pulses, with no lower extremity edema, cyanosis, clubbing, or ulcers.  NEUROLOGIC: Oriented to time, place, and person, with normal mood and affect.   ASSESSMENT: This is a 64 year old male with colon cancer and chest discomfort resolved at this time without evidence of myocardial infarction although has minimal elevation of troponin elevation consistent with demand ischemia at low risk for major cardiovascular event.   RECOMMENDATIONS:  1.  No further cardiac intervention at this time with a minimal elevation of troponin, most consistent with demand ischemia. 2.  Continue ambulation and follow for any further significant symptoms.  3.  Consider echocardiogram for LV systolic dysfunction if receiving more chemotherapy and other types of intervention.  4.  No stress test at this time due to no current evidence of chest discomfort or significant EKG changes. 5.  Continue working with palliative care.    ____________________________ Lamar BlinksBruce J. Mccormick Macon, MD bjk:mc Elliott: 02/05/2015 13:42:35 ET T: 02/05/2015 14:25:27 ET JOB#: [id number]  cc: Lamar BlinksBruce J. Sinahi Knights, MD, <Dictator> Lamar BlinksBRUCE J Khalaya Mcgurn MD ELECTRONICALLY SIGNED 02/08/2015 8:31

## 2015-04-14 NOTE — Consult Note (Signed)
I curb side consulted Dr. Birdie SonsByrnette and showed him the abd films and he recommended a gentle limited barium enema to see if he has a volvulus or obstruction.   Electronic Signatures: Scot JunElliott, Robert T (MD)  (Signed on 24-Feb-16 09:01)  Authored  Last Updated: 24-Feb-16 09:01 by Scot JunElliott, Robert T (MD)

## 2015-04-14 NOTE — Consult Note (Signed)
Note Type Consult   HPI: This 64 year old Male patient presents to the clinic for follow up  colon cancer.  Subjective: Chief Complaint/Diagnosis:   Stage IV colon cancer with pulmonary and brain metastasis. Admitted for chest pain. HPI:   Patient is a 64 year old male with stage IV colon cancer undergoing chemotherapy who was recently admitted for chest pain. No cardiac or pulmonary etiology was determined. Recent CT scan prior to admission revealed progression of his disease. Currently, he feels well and is back to his baseline. His abdomen is distended. He continues to have a good appetite, but continues to lose weight.  He continues to have mild diarrhea which does not affect his day-to-day activity. He has no neurologic complaints. He denies any recent fevers. He continues to have neuropathy. He denies any nausea, vomiting, or constipation. Patient offers no further specific complaints.   Review of Systems:  Performance Status (ECOG): 1  Pain ?: No complaints (0, none)  Emotional well-being: None  Review of Systems:   As per HPI. Otherwise, 10 point system review was negative.   Allergies:  No Known Allergies:   Preventive Screening:  Has patient had any of the following test? Colonscopy  Prostate Exam   Last Colonoscopy: 2012   Last Prostate Exam: Never   Smoking History: Smoking History Quit 2-3 months ago, only smoked 2 per week.  PFSH: Additional Past Medical and Surgical History: Stage II colon cancer, hypertension partial bowel resection.    Family history: CVA, diabetes, hypertension.  Grandmother died in her 11s of colon cancer.    Social history:  Tobacco as above, occasional alcohol.   Home Medications: Medication Instructions Last Modified Date/Time  amLODIPine 10 mg oral tablet 1 tab(s) orally once a day 22-Feb-16 13:04  Klor-Con M20 20 mEq oral tablet, extended release 2 tab(s) orally 2 times a day 22-Feb-16 13:04  magnesium oxide 500 mg oral tablet 1  tab(s) orally once a day 22-Feb-16 13:04   Vital Signs:  :: vital signs stable, patient afebrile.   Physical Exam:  General: well developed, well nourished, and in no acute distress  Mental Status: normal affect  Eyes: anicteric sclera  Respiratory: clear to auscultation bilaterally  Cardiovascular: regular rate and rhythm, no murmur, rub, or gallop  Gastrointestinal: soft, mildly distended.  normal active bowel sounds  Musculoskeletal: No edema  Skin: No rash or petechiae noted  Neurological: alert, answering all questions appropriately.  Cranial nerves grossly intact   Laboratory Results: LabObservation:  23-Feb-16 18:17   OBSERVATION Reason for Test  Routine Chem:  23-Feb-16 04:22   Potassium, Serum  2.6  Cholesterol, Serum 121  Triglycerides, Serum 50  HDL (INHOUSE) 55  VLDL Cholesterol Calculated 10  LDL Cholesterol Calculated 56 (Result(s) reported on 05 Feb 2015 at 05:33AM.)  Glucose, Serum  102  BUN 10  Creatinine (comp) 0.78  Sodium, Serum 142  Chloride, Serum 106  CO2, Serum 31  Calcium (Total), Serum  8.2  Anion Gap  5  Osmolality (calc) 282  eGFR (African American) >60  eGFR (Non-African American) >60 (eGFR values <55m/min/1.73 m2 may be an indication of chronic kidney disease (CKD). Calculated eGFR, using the MRDR Study equation, is useful in  patients with stable renal function. The eGFR calculation will not be reliable in acutely ill patients when serum creatinine is changing rapidly. It is not useful in patients on dialysis. The eGFR calculation may not be applicable to patients at the low and high extremes of body sizes,  pregnant women, and vegetarians.)    13:49   Potassium, Serum  2.6 (Result(s) reported on 05 Feb 2015 at 02:10PM.)  Routine Hem:  23-Feb-16 04:22   WBC (CBC) 6.1  RBC (CBC)  3.24  Hemoglobin (CBC)  11.4  Hematocrit (CBC)  32.7  Platelet Count (CBC)  132  MCV  101  MCH  35.2  MCHC 35.0  RDW  18.1  Neutrophil % 53.6   Lymphocyte % 25.3  Monocyte % 18.8  Eosinophil % 1.3  Basophil % 1.0  Neutrophil # 3.3  Lymphocyte # 1.6  Monocyte #  1.2  Eosinophil # 0.1  Basophil # 0.1 (Result(s) reported on 05 Feb 2015 at 05:33AM.)   Medical Imaging Results:   Review Medical Imaging   Chest, Abd, and Pelvis With Contrast 01-Feb-2015 15:58:00: IMPRESSION:  Chest Impression:    1. Marked progression of pulmonary metastasis. Marked enlargement of  nodules within the right lower lobe and right middle lobe. New  pulmonary nodule the left lower lobe.  2. Progression of mediastinal nodal metastasis.  3. Progression of right infrahilar nodal metastasis constricting the  right lower lobe bronchus.    Abdomen / Pelvis Impression:    1. Marked gaseous distension of the colon consistent with a chronic  colonic distention.  2. Skeletal metastasis at L2 and T 9.      Electronically Signed    By: Suzy Bouchard M.D.    On: 02/01/2015 17:58         Verified By: Rennis Golden, M.D.,  Assessment and Plan: Impression:   Stage IV colon cancer with pulmonary and brain metastasis. Plan:   1.  Colon cancer:  CT scan results as above with clear progression of disease. Patient's CEA is also increasing. _0  has expressed interest in continuing palliative chemotherapy. Return to clinic in 1 week on February 11, 2015 for hospital follow-up and treatment planning. Consider treating with single agent Regofrafinib. Of note, would be concerned of patient's compliance with oral chemotherapy.  Patient understands he can return to clinic at any time if he has any questions, concerns, or complaints. 2. Hypokalemia:  IV replacement as needed. Continue oral supplementation to 40 mEq twice per day, patient admits he has not been taking it as directed. Abdominal distention: Appreciate GI input, possible colonoscopy in the near future.Anemia: Chronic. Stable, monitor.Disposition: Patient will likely be discharged in the next several days with follow-up  in the [place] as above. NOT RESUSCITATE/DO NOT RESUSCITATE.   consult, call with questions.  Tumor Staging:  Tumor Staging Tumor Staging   Tumor Staging Source Clinical   Tumor TX   Node NX   Metastasis M1   Stage IV   Cancer Status Evidence of disease clinically   Advance Directive:  Advance Directive ([place]) no   Advance Directive Information Given patient refused   Electronic Signatures: Delight Hoh (MD)  (Signed 23-Feb-16 18:24)  Authored: Note Type, History of Present Illness, CC/HPI, Review of Systems, ALLERGIES, Preventive Screening, Smoking Cessation, Patient Family Social History, HOME MEDICATIONS, Vital Signs, Physical Exam, Lab Results Review, Rad Results Review, Assessment and Plan, Quality Measures, Advance Directive   Last Updated: 23-Feb-16 18:24 by Delight Hoh (MD)

## 2015-04-14 NOTE — Consult Note (Signed)
Reason for Visit: This 63 year old Male patient presents to the clinic for initial evaluation of  stage IV colon cancer .   Referred by Dr. Grayland Ormond.  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 Patient has previous history of CyberKnife therapy over 2 years prior at [facility] for frontal temporal brain metastasis  Imaging Report MRI of brain as well as chest CT reviewed   Referral Report clinical notes reviewed   Planned Treatment Regimen palliative radiation therapy to brain and chest   HPI   patient is a 64 year old male now about 2 years since diagnosed with stage IV colon cancer with pulmonary and brain metastasis.he has had multiple chemotherapies interventions most recently oral Regofrafinib. initially over 2 years ago patient did have CyberKnife therapy at [facility] for frontal lobe brain metastasis without significant side effect.He developed a facial drop recently had an MRI scan of the brain performed showing at least 6 brain metastasis with 2 largest located in the posterior right frontal and superior left temporal lobes with mild to moderate surrounding edema. Chest CT shows marked progression of pulmonary metastasis with a special program progression the right middle lobe with some constriction of the great vessels of the chest in the superior vena cava.he has been started on steroids with some resolution of facial drop. No change in visual fields no focal motor or sensory levels are noted. I've been asked to evaluate the patient for palliative radiation therapy to his whole brain.  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 Chronic coumadin.   Hypertension:    Brain Met:    Lung Met:    Colon Cancer:   Past, Family and Social History:  Past Medical History positive   Cardiovascular hypertension   Respiratory left upper  lobe DVT and SVC thrombus   Past Medical History Comments hypokalemia   Family History positive   Family History Comments family history of adult-onset diabetes hypertension CVA and grandmother with colon cancer   Social History positive   Social History Comments recent has quit smoking 40 pack years smoking history prior to that social EtOH use history   Additional Past Medical and Surgical History seen by himself today   Allergies:   No Known Allergies:   Home Meds:  Home Medications: Medication Instructions Status  dexamethasone 4 mg oral tablet 2 tab(s) orally 3 times a day Active  Lomotil 0.025 mg-2.5 mg oral tablet 1-2 tab(s) orally 4 times a day as needed for diarrhea. Active  regorafenib 40 mg oral tablet 4 tab(s) orally once a day for 21 days with 7 days off Active  senna 1 tab(s) orally 2 times a day, As Needed, constipation , As needed, constipation Active  amLODIPine 10 mg oral tablet 1 tab(s) orally once a day Active  Klor-Con M20 20 mEq oral tablet, extended release 2 tab(s) orally 2 times a day Active  magnesium oxide 500 mg oral tablet 1 tab(s) orally once a day Active   Review of Systems:  General negative   Performance Status (ECOG) 0   Skin negative   Breast negative   Ophthalmologic negative   ENMT negative   Respiratory and Thorax negative   Cardiovascular negative   Gastrointestinal see HPI   Genitourinary negative   Musculoskeletal negative   Neurological negative   Psychiatric negative  Hematology/Lymphatics negative   Endocrine negative   Allergic/Immunologic negative   Nursing Notes:  Nursing Vital Signs and Chemo Nursing Nursing Notes: *CC Vital Signs Flowsheet:   28-Mar-16 10:24  Temp Temperature 96.3  Pulse Pulse 74  Respirations Respirations 18  SBP SBP 127  DBP DBP 80  Pain Scale (0-10)  0  Current Weight (kg) (kg) 69.6  Height (cm) centimeters 187.4  BSA (m2) 1.9   Physical Exam:  General/Skin/HEENT:   General normal   Skin normal   Eyes normal   ENMT normal   Head and Neck normal   Additional PE well-developed somewhat lethargic male in NAD fundi are benign bilaterally cranial nerves II through XII are grossly intact. Visual fields within normal range motor sensory and DTR levels are equal and symmetric in upper and lower extremities. Lungs show some slight decreased breath sounds in the right lung field left lung field clear cardiac examination shows regular rate and rhythm.her perception is intact.   Breasts/Resp/CV/GI/GU:  Respiratory and Thorax normal   Cardiovascular normal   Gastrointestinal normal   Genitourinary normal   MS/Neuro/Psych/Lymph:  Musculoskeletal normal   Neurological normal   Lymphatics normal   Other Results:  Radiology Results: MRI:    24-Mar-16 09:41, MRI Brain  With/Without Contrast  MRI Brain  With/Without Contrast   REASON FOR EXAM:    Stage IV colon CA Hx brain mets New facial drooping   Difficulty speaking  COMMENTS:       PROCEDURE: MR  - MR BRAIN WO/W CONTRAST  - Mar 07 2015  9:41AM     CLINICAL DATA:  Left facial droop for 1 week. Metastatic colon  cancer.    EXAM:  MRI HEAD WITHOUT AND WITH CONTRAST    TECHNIQUE:  Multiplanar, multiecho pulse sequences of the brain and surrounding  structures were obtained without and with intravenous contrast.  CONTRAST:  15 mL MultiHance    COMPARISON:  Head CT 10/15/2013    FINDINGS:  There is no evidence of acute infarct, midline shift, or extra-axial  fluid collection. Chronic lacunar infarct is noted in the posterior  right putamen. Ventricles and sulci are within normal limits for  age.    There is a 5 mm ring-enhancing lesion in the posterior right  temporal lobe with associated chronic blood products and no  surrounding edema (series 14, image 25). Predominantly peripherally  enhancing lesion in the posterior right frontal lobe with chronic  hemorrhageand mild-to-moderate  surrounding edema measures 14 x 13  mm (series 14, image 35). There is a punctate 2 mm enhancing lesion  in the medial, posterior right frontal lobe just superior to the  larger lesion (series 14, image 38).    10 x 6 mm enhancing anterior right temporal lesion has at most  minimal surrounding edema (series 14, image 14). Solidly enhancing  lesion in the left superior temporal gyrus with mild surrounding  edema measures 16 x 13 mm (series 14, image 26). 7 mm ring-enhancing  lesion with minimal surrounding edema is present in the left frontal  lobe (series 14, image 33). No infratentorial lesions are  identified.    Orbits are unremarkable. Right maxillary sinus mucous retention cyst  is again seen. There is a trace left mastoid effusion. Major  intracranial vascular flow voids are preserved. Bone marrow signal  of the skull and visualized upper cervical spine is mildly to  moderately diminished diffusely without enhancing lesion identified.     IMPRESSION:  1. Six brain metastases,  with the two largest located in the  posterior right frontal and superior left temporal lobes with  mild-to-moderate surrounding edema. No midline shift.  2. No acute infarct.      Electronically Signed    By: Logan Bores    On: 03/07/2015 11:09       Verified By: Ferol Luz, M.D.,  CT:    19-Feb-16 15:58, CT Chest, Abd, and Pelvis With Contrast  CT Chest, Abd, and Pelvis With Contrast   REASON FOR EXAM:    Restaging Stage IV colon CA  COMMENTS:       PROCEDURE: KCT - KCT CHEST ABDOMEN AND PELVIS W  - Feb 01 2015  3:58PM     CLINICAL DATA:  Stage IV colon cancer. Subsequent treatment strategy    EXAM:  CT CHEST, ABDOMEN, AND PELVIS WITH CONTRAST    TECHNIQUE:  Multidetector CT imaging of the chest, abdomen and pelvis was  performed following the standard protocol during bolus  administration of intravenous contrast.  CONTRAST:  100 mL Omnipaque 350    COMPARISON:  Adrenal glands and  kidneys are unchanged. There is  calculus in right kidney.    FINDINGS:  CT CHEST FINDINGS    Mediastinum/Nodes: No axillary or supraclavicular lymphadenopathy.  Large partially calcified right lower paratracheal nodal mass is  increased in volumemeasuring 4.3 x 5.8 cm in axial dimension  compared to 4.7 x 5.0 cm on comparison exam. Lymph nodes surrounding  the bronchus intermedius measure 5.0 x 2.0 in the right infrahilar  nodal station which are new from comparison exam. There is  constriction of the right lower lobe bronchus.  Lungs/Pleura: Enlarged pulmonary nodules within the right lower lobe  measure up to mass size in conglomerate with 3.1 x 2.6 cm  conglomerate of nodules/mass increased from 2.5 x 1.7 cm (image 29,  series 5). Increase in nodular thickening within the medial lingula  measuring 4.2 x 2.2 compared to 3.1 x 2.2 cm. Within the left lung,  irregular nodule in the left upper lobe measures 13 mm increased  from 9 mm. Left lower lobe nodule measures 9 mm increased from not  present on prior.    Musculoskeletal: No skeletal metastasis in the thorax    CT ABDOMEN AND PELVIS FINDINGS    Hepatobiliary: No focal hepatic lesion.  Pancreas: Pancreas is normal. No ductal dilatation. No pancreatic  inflammation.    Spleen:  Scattered low-density lesions the spleen are unchanged    Adrenals/Urinary Tract: Adrenal glands and kidneys stable    Stomach/Bowel: Stomach small bowel are normal. There is marked  distention of the colon with gas which is not changed from prior.  There is fluid in the rectum. No obstructing lesion or mass  identified.    Vascular/Lymphatic: No retroperitoneal periportal lymphadenopathy.  Abdominal aorta is normal. No pelvic lymphadenopathy.  Reproductive: Prostate gland is normal. No aggressive osseous lesion    Musculoskeletal: Increased sclerosis of the lesions at L2 .  Increased sclerosis of lesion at T9. These findings are  consistent  with skeletal metastasis. The sclerotic lesion at L2 measures 2.5  cm.     IMPRESSION:  Chest Impression:    1. Marked progression of pulmonary metastasis. Marked enlargement of  nodules within the right lower lobe and right middle lobe. New  pulmonary nodule the left lower lobe.  2. Progression of mediastinal nodal metastasis.  3. Progression of right infrahilar nodal metastasis constricting the  right lower lobe bronchus.    Abdomen /  Pelvis Impression:    1. Marked gaseous distension of the colon consistent with a chronic  colonic distention.  2. Skeletal metastasis at L2 and T 9.      Electronically Signed    By: Suzy Bouchard M.D.    On: 02/01/2015 17:58         Verified By: Rennis Golden, M.D.,   Relevent Results:   Relevant Scans and Labs CT scans of chest and brain MR reviewed   Assessment and Plan: Impression:   stage IV colon cancer with brain and lung metastasisin 64 year old male status post multiple chemotherapy interventions as well as previous CyberKnife therapy at [facility] for frontal temporal brain metastasis. Plan:   based on the progressive nature of his disease in his brain would like to go ahead with whole brain radiation therapy to 3000 cGy in 10 fractions.believe his CyberKnife therapy over 2 years prior will not affect overall morbidity or side effect profile in this patient. Risks and benefits of treatment including hair loss, fatigue, scalp reaction change in cognitive function and possible alteration of blood counts all were explained in detail to the patient. He seems to comprehend my treatment plan well. I have set up and ordered CT simulation for later this week. I'm also concerned about the large bulky disease in his chest compromising his SVC. Should his brain radiation go well I will also evaluate him and planned for palliative radiation therapy to his chest again 3000 cGy in 10 fractions to try to prevent further SVC  syndrome. I discussed the case personally with medical oncology. Patient will continue on steroids throughout therapy.  I would like to take this opportunity for allowing me to participate in the care of your patient..  Fax to Physician (Removed):       Electronic Signatures: Clyda Smyth, Roda Shutters (MD)  (Signed 30-Mar-16 13:21)  Authored: HPI, Diagnosis, Past Hx, PFSH, Allergies, Home Meds, ROS, Nursing Notes, Physical Exam, Other Results, Relevent Results, Encounter Assessment and Plan, Fax to Physician   Last Updated: 30-Mar-16 13:21 by Armstead Peaks (MD)

## 2015-04-14 NOTE — Consult Note (Signed)
PATIENT NAME:  Isaac Elliott, Isaac Elliott MR#:  [id number] DATE OF BIRTH:  06/13/1951  DATE OF CONSULTATION:  02/05/2015  REFERRING PHYSICIAN:  Elby Showersatherine Walsh, MD  DATE OF CONSULTATION: 10/12/2014.   CONSULTING PHYSICIAN:  Isaac Prudeobert Elliott, MD/ Amedeo KinsmanKimberly Josimar Corning, ANP (adult nurse practitioner)   REASON FOR CONSULTATION: Severely dilated colon in a patient with known colon cancer who is still passing stool. Question whether he could have a partial obstruction.   HISTORY OF PRESENT ILLNESS: This 64 year old patient with history of stage IV colon cancer with metastasis to the brain and lungs presented to the hospital yesterday with acute onset of chest pain with shortness of breath. He denied nausea, vomiting, and diaphoresis. He was sitting at rest at the time. Admission troponin was 0.08 and EKG showed nonspecific ST changes. His chest pain resolved without evidence of myocardial infarction and it was thought that the minimal elevation in troponin was consistent with demand ischemia. Dr. Arnoldo HookerBruce Kowalski saw him in consultation and recommended echocardiogram if more chemotherapy was planned.  The patient underwent a CT of the chest, abdomen and pelvis with contrast performed 02/01/2015 for routine restaging of stage IV colon cancer. The impression was marked pulmonary metastasis, progression of mediastinal nodal metastasis, progression of right infrahilar nodal metastasis, and marked gaseous distention of the colon consistent with a chronic colonic distention, and skeletal metastasis at L2 and 29.    The patient did undergo CT angiography of the chest to rule out for PE and there was widespread adenopathy as well as pulmonary metastasis seen. There was an increase in pneumonitis surrounding a mass in the right lower lobe. There was sclerotic bone metastasis at T10. There was marked large bowel dilatation incompletely visualized but grossly stable compared to 3 days prior.  A KUB was performed on 02/04/2015 that showed  severe gaseous distention of the colon stable from 02/01/2015 CT of the abdomen and pelvis.   The patient reports that he has had abdominal distention for some time. He is eating a regular diet without nausea or vomiting or significant pain. He is passing stools loose 3-4  times a day which has been his baseline. He denies blood or melena. He denies feeling abdominal  pain but does feel a little uncomfortable, a little bloating for the last 5-6 months. He says nothing keeps him from eating a good diet though. The patient continues to lose weight- 60 lbs in 6 mos. He has been receiving palliative chemotherapy by Dr. Jari SportsmanFinnagen which is currently on hold. There was discussion regarding further treatment planning. The patient does have a history of  SVC clot. He  has not been taking his prescribed Coumadin, so it was discontinued.  He also presented severely hypokalemic and was not taking his potassium as ordered. He  readily admits that he may have skipped a few doses. Currently, the patient offers no complaints.   PAST MEDICAL HISTORY:  1.  Stage IV colon cancer with pulmonary and brain metastasis.  2.  Hypertension.  3.  History of partial bowel resection.  4.  Last colonoscopy was in 2012.  5.  Hypokalemia.  6.  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  7.  Bone metastasis.  8.  Colon cancer stage IV with palliative chemotherapy.  HOME MEDICATIONS: 1.  Magnesium oxide 500 mg 1 tablet once a day.  2.  Klor-Con 20 mEq 2 tablets twice daily and missing a few doses.  3.  Amlodipine 10 mg once daily.  4.  Lasix as needed for edema.   ALLERGIES: NKDA.   HABITS: Positive for prior tobacco use, discontinued. Rare alcohol use heavier in the past.  History of marijuana.   SOCIAL HISTORY:  He lives with his mother and other family member.   FAMILY HISTORY: Positive for colon cancer in grandmother  deceased in her 42s. Positive for CVA, diabetes, and hypertension.   REVIEW OF SYSTEMS: Positive for weight loss 60 pounds over 6 months. Positive for some weakness. Mild abdominal discomfort, excellent appetite despite weight loss. He had chest pain on admission, no recurrence. Remaining 12 systems questioned and otherwise negative.   PHYSICAL EXAMINATION: VITAL SIGNS:  Weight is 97.8, pulse is 77, respirations 18, blood pressure 105/74. Pulse oximetry on room air is 93% GENERAL: Elderly African American male sitting up in a chair watching TV and NAD. The patient is conversive and very pleasant.  HEENT: Head is normocephalic. Conjunctivae pink. Sclerae anicteric. Oral mucosa was moist.  Poor dentition noted.  NECK: Supple. Trachea midline.  CARDIAC: S1, S2 without murmur, rub, or gallop.  LUNGS: CTA with the exception of very slight bibasilar crackles, no wheezing noted. Respirations are nonlabored.  ABDOMEN:  Grossly distended. Tympanic, with tinkling bowel sounds. No specific areas of tenderness. Cannot palpate for organomegaly.  RECTAL: Deferred.  SKIN: Warm and dry.  NEUROLOGIC: Grossly intact, good reasonable historian.  PSYCHIATRIC: Affect and mood, appears tearful.   LABORATORY DATA: Admission blood work notable for BUN 13, creatinine 0.84, potassium 2.1 that has been supplemented and remains 2.6, troponin 0.08 x 3. WBC 7.8. Hemoglobin is 11.9, platelet count 171,000 to 132,000 today. MCV is 101. Elliott-dimer 620 nanogram per mL. Normal is 0 to 499.   Repeat laboratory studies on 02/05/2015 with potassium 2.6, hemoglobin 11.4, platelet count 132,000, MCV 101, MCH 35.2.   RADIOLOGY: CT of the abdomen and pelvis, CT angiography of the chest rule out PE and KUB as noted in history.   IMPRESSION:  1.  The patient is admitted with chest pain and has slight elevation in troponin with nonspecific ST changes. He has been seen by cardiology who did not deem this as an acute myocardial infarction.  Further imaging with echocardiogram if the patient has plans for continued palliative chemotherapy. 2.  We have been consulted regarding chronic abdominal distention, and grossly dilated bowel. The patient is eating well, without significant pain, nausea or vomiting. He has slight abdominal discomfort when questioned. He appears totally comfortable. Abdomen exam, however, is grossly abnormal. The patient could have pseudo obstruction from metastasis. He may or may not be a reasonable candidate for a colonoscopy as the risk of perforation is significantly higher in this clinical situation. I will ask Dr. Mechele Collin to review the x-rays and the abdominal exam for further recommendation. Consider checking a B12, folate, if that has not been looked at recently.    ____________________________ Ranae Plumber. Arvilla Market, ANP (Adult Nurse Practitioner) kam:mc Elliott: 02/05/2015 16:11:28 ET T: 02/05/2015 16:32:12 ET JOB#: [id number]  cc: Cala Bradford A. Arvilla Market, ANP (Adult Nurse Practitioner), <Dictator> Ranae Plumber Suzette Battiest, MSN, ANP-BC Adult Nurse Practitioner ELECTRONICALLY SIGNED 02/05/2015 17:21

## 2015-04-14 NOTE — Consult Note (Signed)
Patient admitted with chest pain concerning for cardiac in nature. Patient's CEA has been steadily rising and recent CT scan revealed progressive metastatic colon cancer. His pain is unlikely secondary to his metastatic disease. Agree with palliative care consult. consult, for consult to follow on Tuesday, February 05, 2015.  Electronic Signatures: Gerarda FractionFinnegan, Hadley Soileau (MD)  (Signed on 22-Feb-16 17:59)  Authored  Last Updated: 22-Feb-16 17:59 by Gerarda FractionFinnegan, Ariane Ditullio (MD)

## 2015-04-14 NOTE — Consult Note (Signed)
Colonoscopic decompression in this patient would be fraught with danger and potential perforation given the severe colonic distention seen on his abd films.  He says he is not in any pain, is moving bowels and passing gas, able to eat.  If I could decompress the colon the distention would very likely come right back.  Strongly do not recommend any colonoscopic intervention.  Palpation shows no peritoneal signs, a few abnormal bowel sounds heard.  Opinion discussed with the patient.  Electronic Signatures: Scot JunElliott, Warner Laduca T (MD)  (Signed on 23-Feb-16 18:58)  Authored  Last Updated: 23-Feb-16 18:58 by Scot JunElliott, Sarena Jezek T (MD)

## 2015-04-14 NOTE — Discharge Summary (Signed)
PATIENT NAME:  Isaac Elliott, TRUMPOWER MR#:  [id number] DATE OF BIRTH:  10/12/1951  DATE OF ADMISSION:  02/04/2015 DATE OF DISCHARGE:  02/06/2015  ADMITTING DIAGNOSIS: Chest pain.   DISCHARGE DIAGNOSES:  1.  Chest pain of unclear etiology at this time.  2.  Elevated troponin, but no acute coronary syndrome, likely demand ischemia per cardiology.  3.  Hypokalemia.  4.  Hypomagnesemia.  5.  History of hypertension.  6.  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  7.  Partial bowel resection.   DISCHARGE CONDITION: Stable.   DISCHARGE MEDICATIONS: The patient is to continue magnesium oxide 500 mg p.o. once daily, Klor-Con 20 mEq 2 tablets twice daily, amlodipine 10 mg p.o. daily, and senna 1 tablet twice daily as needed.   HOME OXYGEN: None.   DIET: 2 grams salt, regular consistency, mechanical soft, easy to digest.   ACTIVITY LIMITATIONS: As tolerated.   FOLLOWUP: Appointment with Dr. Orlie Dakin in 2 days after discharge.   CONSULTANTS: Care management; social worker; Dr. Harvie Junior; Dr. Mechele Collin; Dr. Gwen Pounds; Dr. Orlie Dakin; nurse, Laurette Schimke; nurse, Rowan Blase.    RADIOLOGIC STUDIES: Chest x-ray, PA and lateral, 02/04/2015, showing stable chest as compared to a CT scan of the chest, abdomen, and pelvis on 02/01/2015. Continued severe gaseous distention of visible colon. KUB, 02/222016, showed severe gaseous distention of the colon, stable from 02/01/2015 CT scan results. CT angiography of the chest for pulmonary embolism, 02/04/2015,  revealing no demonstrable pulmonary embolus, widespread adenopathy, as well as pulmonary metastases remain increased and pneumonitis surrounding mass in superior segment of right lower lobe as compared to recent prior study, sclerotic bone metastasis at T10 on the right noted. Marked large bowel dilatation, incompletely visualized, was grossly stable as compared to 3 days ago. Echocardiogram, 02/05/2015, showed left ventricular  ejection fraction by visual estimation 65%-70%, normal global left ventricular systolic function, decreased left ventricular internal cavity size, mild mitral valve regurgitation, mildly increased left ventricular posterior wall thickness, mild tricuspid regurgitation.   BRIEF HISTORY AND HOSPITAL COURSE: The patient is a 64 year old African American male with history of colon carcinoma which is metastatic presents to the hospital with complaints of chest pains. Please refer to Dr. Claris Che admission note on 02/04/2015. On arrival to the Emergency Room, the patient's temperature was 98.4, pulse was 66, respiration rate was 20, blood pressure 133/84,  saturation was 100% on room air. Physical exam revealed somewhat distended, tympanitic with decreased bowel sounds abdomen, but no tenderness to palpation or hepatosplenomegaly were noted. No rebound or guarding were noted. The patient's lab data done on arrival to the Emergency Room revealed a very low potassium level of 2.1, glucose of 102, otherwise BMP was normal. The patient's magnesium level was also low at 1.7. The patient's cardiac enzymes, first set of troponin was 0.08, second set 0.08, and third set 0.08 with normal MB fraction. White blood cell count was normal at 7.8, hemoglobin was 11.9, platelet count was 171,000. D-dimer elevated at 620. EKG showed normal sinus rhythm at 63 beats per minute, possible left atrial enlargement, nonspecific T wave abnormality.   The patient was admitted to the hospital for further evaluation. His cardiac enzymes were cycled and consultation with cardiologist was obtained. Dr. Gwen Pounds saw the patient consultation and felt that the patient did have a mild elevation of troponin, which is consistent with demand ischemia. He recommended no further cardiac interventions, continue ambulation and follow symptoms and get an echocardiogram for left ventricular systolic  dysfunction evaluation. He recommended no stress test at this  time, as there was no current evidence of chest discomfort or significant EKG changes. The patient did have an echocardiogram done, which was unremarkable. The patient was able to ambulate with no significant recurrences of his chest pain.   In regards to hypokalemia, patient's potassium level as well as magnesium levels were supplemented. On  the day of discharge, 02/06/2015, prior to supplementation of potassium his potassium level was 3.2 and magnesium level was 2.6. The patient was advised to continue his potassium as well as magnesium supplementation at home and follow up visit his primary physician, Dr. Orlie DakinFinnegan, for further recommendations and follow those levels.   In regard to hypertension, the patient is blood pressure was well controlled. He is to continue his outpatient management. No changes were made in his medication management.   In regard to stage IV colon carcinoma, the patient was evaluated by gastroenterologist. Initially, Dr. Mechele CollinElliott felt that the patient may benefit from a barium enema; however, after discussion with patient himself, it appeared that he was not willing to undergo this study, so that was not done.   The patient is being discharged home with the above-mentioned medications and followup. On the day of discharge, temperature was 98.5, pulse was 77, respiration rate was 20, blood pressure 105/66, saturation was 93%-95% on room air at rest.   TIME SPENT: 40 minutes.   ____________________________ Katharina Caperima Donabelle Molden, MD rv:bm D: 02/06/2015 18:06:27 ET T: 02/07/2015 03:49:52 ET JOB#: [id number]  cc: Katharina Caperima Mahogani Holohan, MD, <Dictator> Tollie Pizzaimothy J. Orlie DakinFinnegan, MD Katharina CaperIMA Mikel Hardgrove MD ELECTRONICALLY SIGNED 02/16/2015 13:24

## 2015-05-02 ENCOUNTER — Telehealth: Payer: Self-pay

## 2015-05-02 NOTE — Telephone Encounter (Signed)
Encounter opened in error

## 2015-05-02 NOTE — Telephone Encounter (Signed)
Contacted [name], medical records with [facility] to complete follow up survival status based on patient enrollment in [facility] MPR-1 study.  Patient was admitted to [facility] home on 03/28/2015.  [name] informed me that patient was still a [facility].

## 2015-05-15 DEATH — deceased

## 2015-12-15 IMAGING — CT CT CHEST-ABD-PELV W/ CM
2 of 5 series · 13 of 46 positions shown, 15 images · IV contrast (isovue)
Comparison: 10/11/2013

CLINICAL DATA: Restaging for colon carcinoma. History of a partial
college section as well as treatment with chemotherapy and
radiation. Stage IV colon cancer.

EXAM:
CT CHEST, ABDOMEN, AND PELVIS WITH CONTRAST
TECHNIQUE: Multidetector CT imaging of the chest, abdomen and pelvis was
performed following the standard protocol during bolus
administration of intravenous contrast.
CONTRAST:  100 mL of Isovue 370 intravenous contrast

[Series 2: cap with · axial · 0.88mm/px · z∈[-989,-359]mm · 10 of 142 slices shown, 12 images]
[im 8/142  soft-tissue]
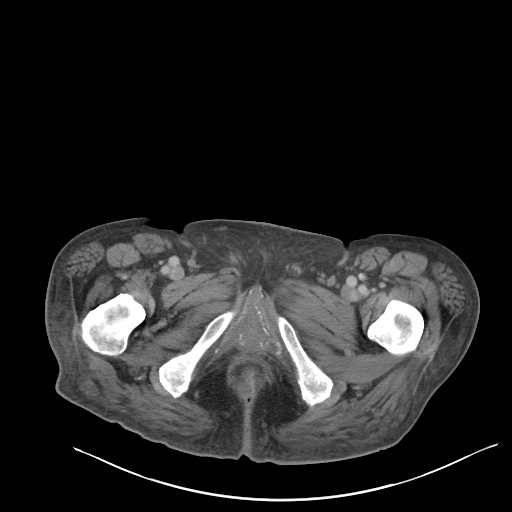
[im 8/142  bone]
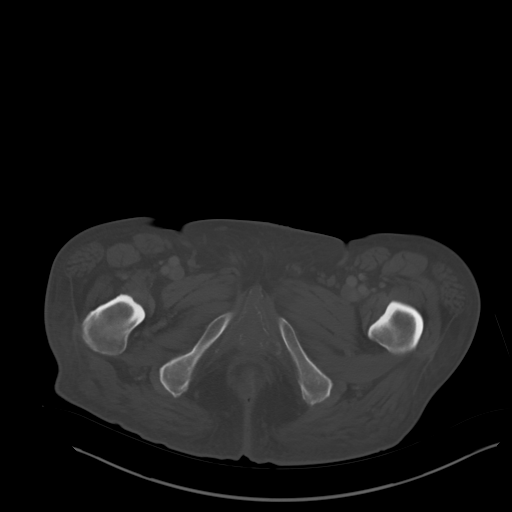
[im 24/142  soft-tissue]
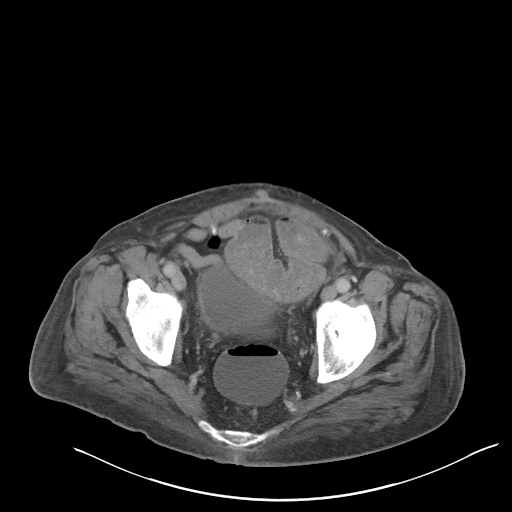
[im 40/142  soft-tissue]
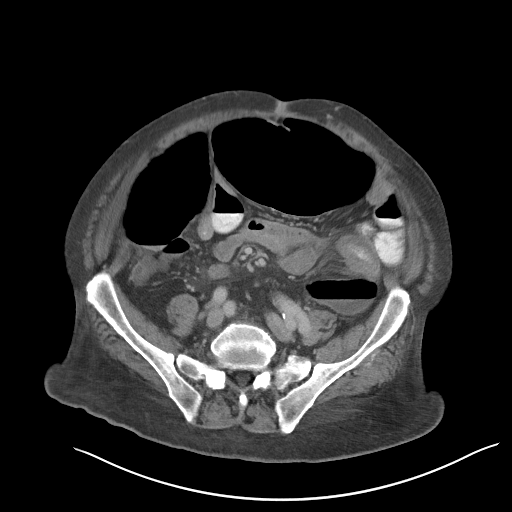
[im 48/142  soft-tissue]
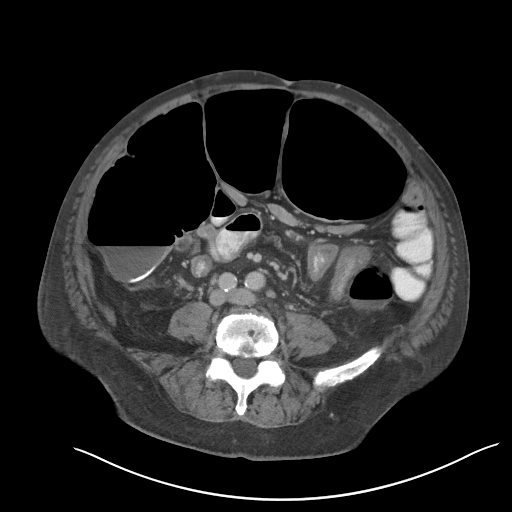
[im 63/142  soft-tissue]
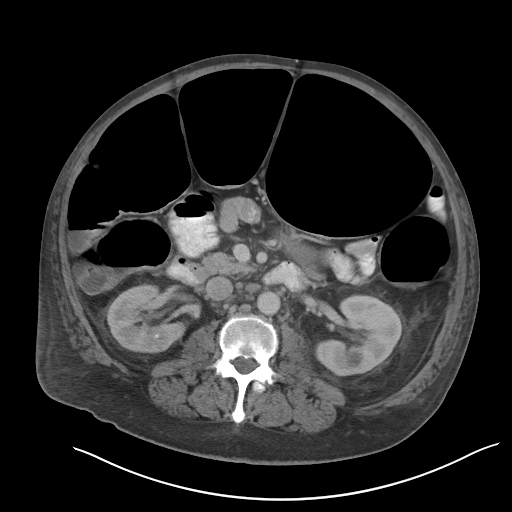
[im 79/142  soft-tissue]
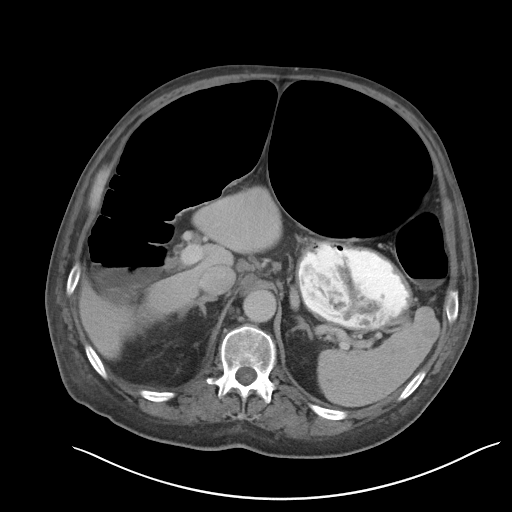
[im 95/142  soft-tissue]
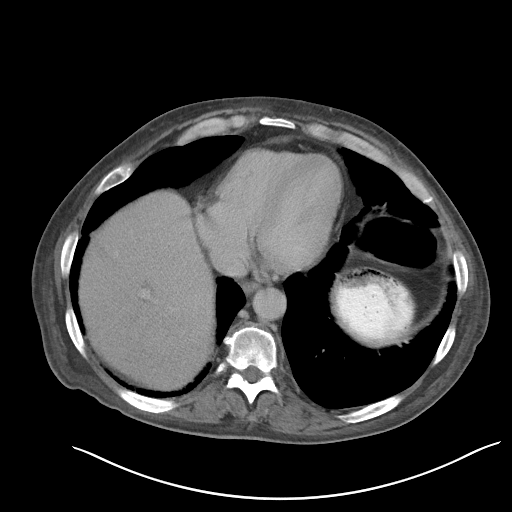
[im 102/142  soft-tissue]
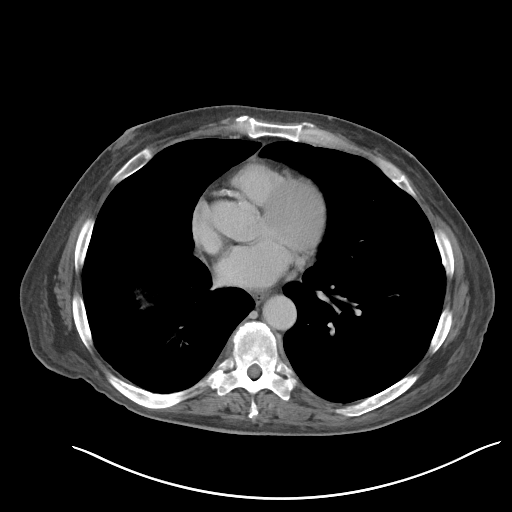
[im 118/142  soft-tissue]
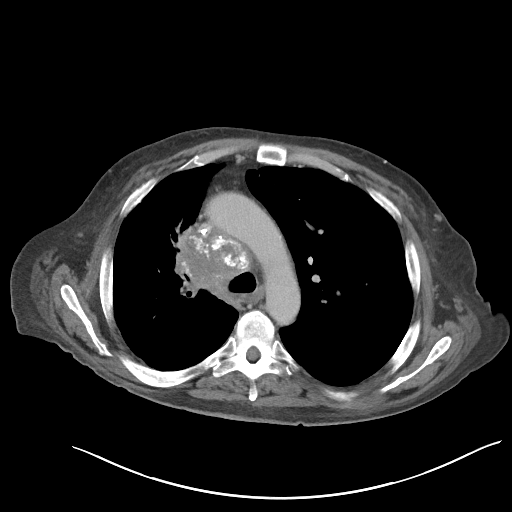
[im 118/142  bone]
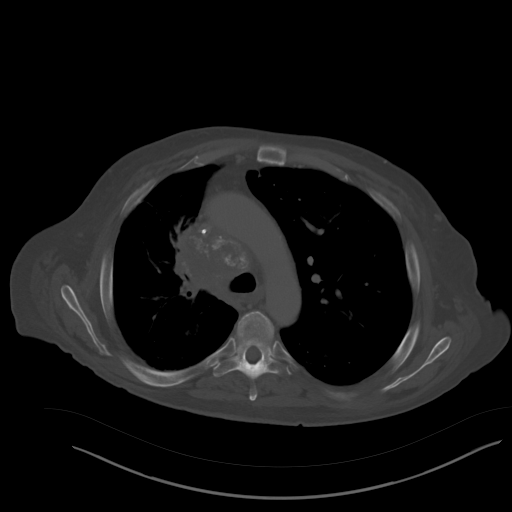
[im 134/142  soft-tissue]
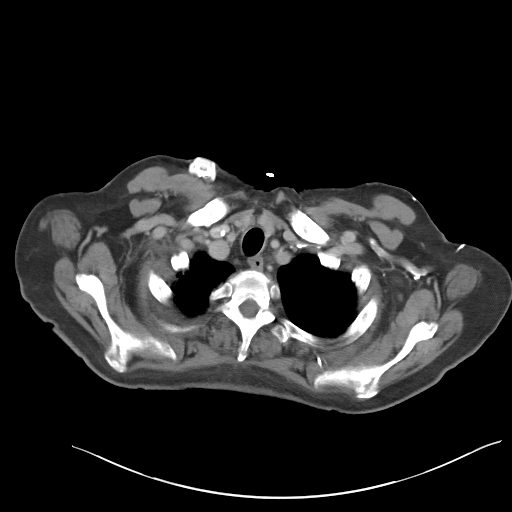

[Series 6: cor cap with · coronal · 0.83mm/px · 3 of 195 slices shown]
[im 65/195  soft-tissue]
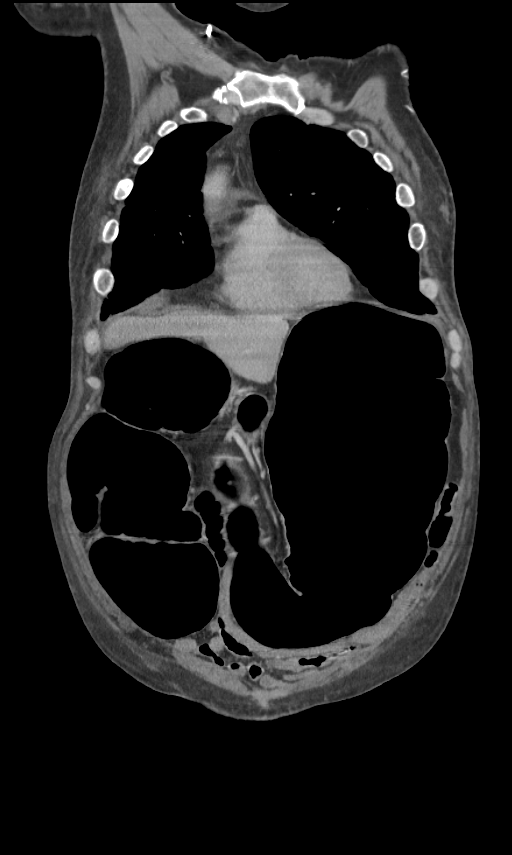
[im 87/195  soft-tissue]
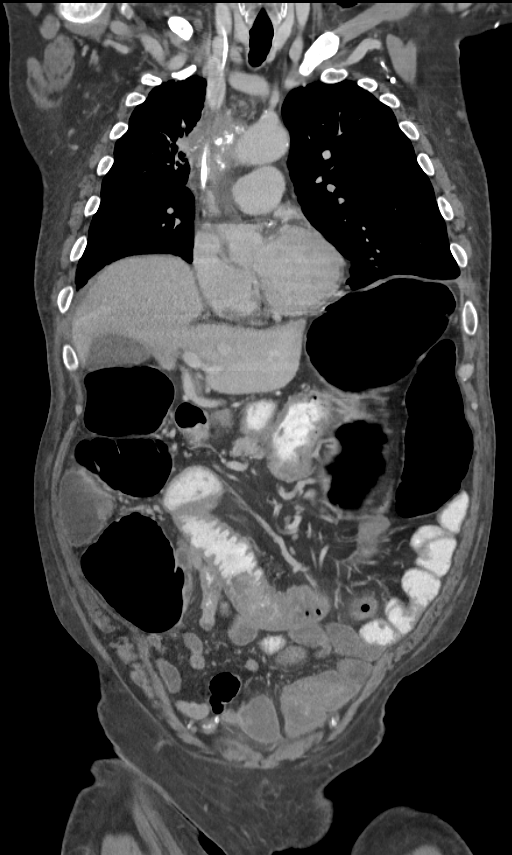
[im 108/195  soft-tissue]
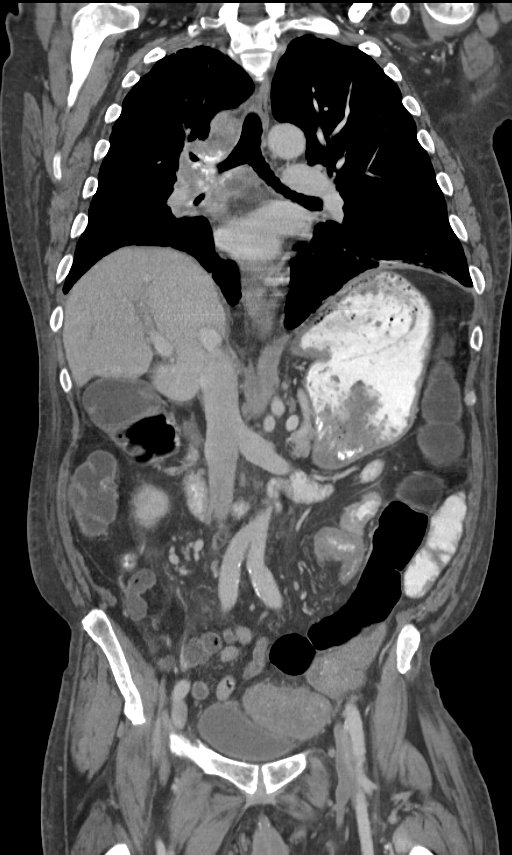

[13 of 46 positions shown; findings below may reference images not displayed]

FINDINGS: CT CHEST FINDINGS

Large heterogeneous mass surrounds the right hilar structures and is
contiguous with a right peritracheal mass. At the level of the right
lower lobe bronchus, it measures 6.8 cm x 4.2 cm the current study.
In this location it previously measured 7.3 cm x 4.5 cm. The mass
encases the right lower lobe bronchus right upper lobe bronchus.
There is mild narrowing of the right upper lobe bronchus, similar to
the prior study. The mass contains calcifications that are also
stable. It encases and mildly narrows the right pulmonary artery
similar to the prior exam. No other mediastinal or hilar masses. No
neck base or axillary masses or adenopathy. Small subcutaneous focus
of soft tissue along the left axilla is nonspecific. It is stable
measuring 9 mm.

Opacity anterior to the right superior hilum along the anteromedial
right upper lobe has mildly decreased in size. There is a spiculated
focal masslike area of opacity in the posterior right lower lobe in
the superior segment. It measures 21 mm x 16 mm. On the prior study
it was larger with central cavitation. It measured 28 mm x 21 mm.
There are 3 subcentimeter focal opacities, 2 in the posterior left
upper lobe and 1 just below this and posterior to this in the left
lower lobe superior segment. Enlarged of these measures 6 mm. These
are without significant change. There is a small irregular focal
opacity in the left upper lobe more inferiorly, on image 33, series
5. It measures 9 mm. It has mildly decreased. It measured 12 mm
previously.

There are no new focal areas of lung opacity. Is of lung scarring
are stable. No pleural effusion.

CT ABDOMEN AND PELVIS FINDINGS

No liver mass or focal lesion.

There are small low-density lesions in the spleen similar to the
prior study. Spleen is normal in size.

Gallbladder and pancreas are unremarkable. No bile duct dilation. No
adrenal masses. Bilateral renal cysts. Nonobstructing stone in the
right kidney. No hydronephrosis. Ureters and bladder are
unremarkable.

There are scattered prominent lymph nodes. Several does have
increased in size from the prior exam. The largest is a
gastrohepatic ligament node measuring 1 cm short axis. There is an
aortocaval narrowed measuring 9 mm in short axis. There multiple
other retroperitoneal top teres celiac and gastrohepatic ligament
lymph nodes that appear mildly increased in size from the prior
study.

No ascites.  No abnormal fluid collections.

There is significant colonic distention. This is greatest out of the
transverse colon where it has a maximum diameter of 13.4 cm. There
is no wall thickening. Air-fluid levels are noted in the colon.
Anastomosis staple line is noted in the lower sigmoid. No bowel mass
is seen. There is no small bowel dilation. There is no free air.

MUSCULOSKELETAL:  No osteoblastic or osteolytic lesions.
IMPRESSION: 1. Confluent passed extending along the right peritracheal
mediastinum and surrounding right hilar structures has mildly
decreased in size.
2. Several irregular lung masses have also mildly decreased in size,
with 3 smaller focal nodular opacities remaining stable.
3. No new mediastinal or hilar masses or adenopathy. No new lung
masses.
4. There are multiple prominent to borderline enlarged lymph nodes
along the retroperitoneum and in the upper abdomen which have mildly
increased in size. This is nonspecific but could reflect metastatic
adenopathy. No other evidence of metastatic disease in the abdomen
pelvis.
5. There is significant colonic distention. Left transverse colon
has a maximum diameter of 13.4 cm. No evidence of bowel obstruction.
No bowel wall thickening. No bowel masses or free air.
